# Patient Record
Sex: Female | Born: 1985 | Race: White | Hispanic: No | Marital: Married | Smoking: Current every day smoker
Health system: Southern US, Community
[De-identification: ages and names within clinical notes are randomized; demographics above are authoritative.]

## PROBLEM LIST (undated history)

## (undated) DIAGNOSIS — J45909 Unspecified asthma, uncomplicated: Secondary | ICD-10-CM

## (undated) HISTORY — PX: OTHER SURGICAL HISTORY: SHX169

---
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
## 2006-03-24 ENCOUNTER — Ambulatory Visit: Payer: Self-pay | Admitting: Psychiatry

## 2006-08-06 ENCOUNTER — Observation Stay: Payer: Self-pay

## 2006-08-08 ENCOUNTER — Inpatient Hospital Stay: Payer: Self-pay

## 2011-03-07 ENCOUNTER — Emergency Department: Payer: Self-pay | Admitting: Emergency Medicine

## 2012-06-26 ENCOUNTER — Ambulatory Visit: Payer: Self-pay | Admitting: Family Medicine

## 2012-10-29 ENCOUNTER — Emergency Department: Payer: Self-pay | Admitting: Emergency Medicine

## 2012-10-29 LAB — URINALYSIS, COMPLETE
Blood: NEGATIVE
Leukocyte Esterase: NEGATIVE
Nitrite: NEGATIVE
Ph: 5 (ref 4.5–8.0)
Protein: NEGATIVE
RBC,UR: 1 /HPF (ref 0–5)
Specific Gravity: 1.006 (ref 1.003–1.030)
Squamous Epithelial: 20
WBC UR: 3 /HPF (ref 0–5)

## 2012-10-29 LAB — CBC
HCT: 41.5 % (ref 35.0–47.0)
HGB: 14.2 g/dL (ref 12.0–16.0)
MCH: 32.7 pg (ref 26.0–34.0)
MCV: 95 fL (ref 80–100)
RBC: 4.35 10*6/uL (ref 3.80–5.20)
RDW: 13.7 % (ref 11.5–14.5)
WBC: 10.3 10*3/uL (ref 3.6–11.0)

## 2012-10-29 LAB — COMPREHENSIVE METABOLIC PANEL
Albumin: 3.6 g/dL (ref 3.4–5.0)
Bilirubin,Total: 0.4 mg/dL (ref 0.2–1.0)
Calcium, Total: 8.9 mg/dL (ref 8.5–10.1)
Creatinine: 0.48 mg/dL — ABNORMAL LOW (ref 0.60–1.30)
Potassium: 3.3 mmol/L — ABNORMAL LOW (ref 3.5–5.1)
SGOT(AST): 22 U/L (ref 15–37)
SGPT (ALT): 27 U/L (ref 12–78)

## 2012-10-29 LAB — PRO B NATRIURETIC PEPTIDE: B-Type Natriuretic Peptide: 43 pg/mL (ref 0–125)

## 2012-10-29 LAB — HCG, QUANTITATIVE, PREGNANCY: Beta Hcg, Quant.: 35399 m[IU]/mL — ABNORMAL HIGH

## 2013-03-31 ENCOUNTER — Observation Stay: Payer: Self-pay

## 2013-04-21 ENCOUNTER — Observation Stay: Payer: Self-pay

## 2013-04-23 ENCOUNTER — Observation Stay: Payer: Self-pay | Admitting: Obstetrics and Gynecology

## 2013-04-27 ENCOUNTER — Inpatient Hospital Stay: Payer: Self-pay | Admitting: Obstetrics and Gynecology

## 2013-04-28 LAB — CBC WITH DIFFERENTIAL/PLATELET
Basophil #: 0.1 10*3/uL (ref 0.0–0.1)
Eosinophil #: 0.1 10*3/uL (ref 0.0–0.7)
HCT: 37.4 % (ref 35.0–47.0)
HGB: 12.8 g/dL (ref 12.0–16.0)
Lymphocyte #: 2.3 10*3/uL (ref 1.0–3.6)
Lymphocyte %: 20.6 %
MCH: 34.5 pg — ABNORMAL HIGH (ref 26.0–34.0)
MCHC: 34.3 g/dL (ref 32.0–36.0)
Monocyte %: 8.4 %
Platelet: 167 10*3/uL (ref 150–440)
RBC: 3.72 10*6/uL — ABNORMAL LOW (ref 3.80–5.20)

## 2013-04-30 LAB — HEMATOCRIT: HCT: 34.5 % — ABNORMAL LOW (ref 35.0–47.0)

## 2014-09-27 NOTE — H&P (Signed)
L&D Evaluation:  History:  HPI 29 year old G2 P1001 with EDC=04/21/2013 by LMP=07/15/2012 presents at  40  weeks from office with for prolonged monitoring after baby had a variable deceleration in an otherwise reactive strip. AFI=10.73cm. Baby moving well. Denies VB or LOF. Has had some irregular ctxs.  Prenatal care begun at [facility] and [facility]. 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 LABS: AB POS, RI, VI, GBS negative. Received TDAP 9/29   Presents with Variable decel during NST in office.   Patient's Medical History Asthma  Hx of depression and substance abuse (past hx of cocaine, Vicodin and Xanax abuse as teen) (Hospitalized x 1 week for abuse of RX meds). 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 Albuterol prn   Allergies Sulfa   Social History tobacco  drugs  MJ in early pregnancy/ 2-3 cigs per day   Family History Non-Contributory   ROS:  ROS see HPI   Exam:  Vital Signs stable  117/74, 103/62   Urine Protein not completed   General no apparent distress   Mental Status clear   Chest wheezing scattered all fields   Heart normal sinus rhythm, no murmur/gallop/rubs   Abdomen gravid, non-tender   Estimated Fetal Weight 6#   Fetal Position cephalic   Edema no edema   Pelvic no external lesions, 1/60%/-1 (no change from office)   Mebranes Intact   FHT Initially baseline 160-170s with much fetal movement and hiccups, then  baseline changed to 140s then 130s over the last 1 1/2 hours of monitoring with accel;s to 160s. No decelerations noted over 3 hours of  monitoring.   Ucx irregular   Skin dry   Impression:  Impression IUP at 40 weeks with reactive, reassuring tracing.   Plan:  Plan DC home with labor precautions. FKCs daily. RTN to L&D Friday for NST. Has another NST/AFI scheduled for 8 Dec and IOL scheduled for 9 Dec (Cervidil)   Electronic Signatures: Trinna BalloonGutierrez, Geoge Lawrance L (CNM)  (Signed 04-Dec-14 00:25)  Authored: L&D Evaluation   Last Updated: 04-Dec-14 00:25 by Trinna BalloonGutierrez, Bintou Lafata L (CNM)

## 2014-09-27 NOTE — H&P (Signed)
L&D Evaluation:  History:  HPI 29 year old G2 P1001 with EDC=04/21/2013 by LMP=04/21/2013 presents at 37 weeks from office with c/o decreased FM today. Felt baby move once this AM. Denies VB. Has had occasional clear discharge. Some pelvic pressure, not sure if contracting. c/o some swelling in face, hands and feet, but BP in office 92/62. Prenatal care begun at [facility] and [facility]. 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 LABS: AB POS, RI, VI.   Presents with decreased fetal movement   Patient's Medical History Asthma  Hx of depression and substance abuse (past hx of cocaine, Vicodin and Xanax abuse as teen) (Hospitalized x 1 week for abuse of RX meds). Mild MVP with regurg. +IPPD this pregnancyitation.   Patient's Surgical History Adenoidectomy   Medications QVAR-1 puff BID, PNV, Zyrtec, Zantac 75 mg. Albuterol prn   Allergies Sulfa   Social History tobacco  drugs  MJ in early pregnancy/ 2-3 cigs per day   Family History Non-Contributory   ROS:  ROS see HPi   Exam:  Vital Signs 107/60   Urine Protein not completed   General no apparent distress   Mental Status clear   Abdomen gravid, non-tender   Fetal Position cephalic   Edema trace edema in feet   Pelvic no external lesions, cx=FT/75%/-2   Mebranes Intact, Nitrazine negative   FHT normal rate with no decels, 135 with accels to 150s to 160s   FHT Description mod variability   Ucx initially not picking up then q2-3 x 30-50 sec, occ ctx lasting 60 sec, mild   Skin dry   Impression:  Impression IUP at 37 weeks with reactive NST/negative CST   Plan:  Plan DC home with labor precautions. Continue FKCs daily   Electronic  Signatures: Trinna BalloonGutierrez, Onita Pfluger L (CNM)  (Signed [phone]-Nov-14 00:24)  Authored: L&D Evaluation   Last Updated: 13-Nov-14 00:24 by Trinna BalloonGutierrez, Makenna Macaluso L (CNM)

## 2014-12-10 IMAGING — US US OB LIMITED
1 series · 14 of 17 positions shown · non-contrast
Comparison: none

REASON FOR EXAM: 15 weeks pregnant, abdominal pain, syncope
COMMENTS:   May transport without cardiac monitor

PROCEDURE:     US  - US LIMITED OB  - October 29, 2012  [DATE]
RESULT:     Comparison: None.
TECHNIQUE: Multiple grayscale and color Doppler images were obtained of the
pelvis by transabdominal ultrasound.

[Series 1: us ob limited · 0.23mm/px · 17 acquisitions, 14 frames shown]
[im 1/17]
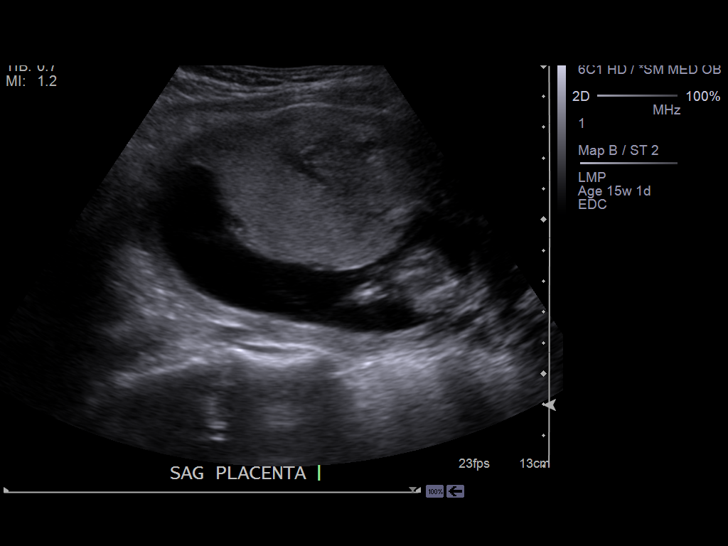
[im 2/17]
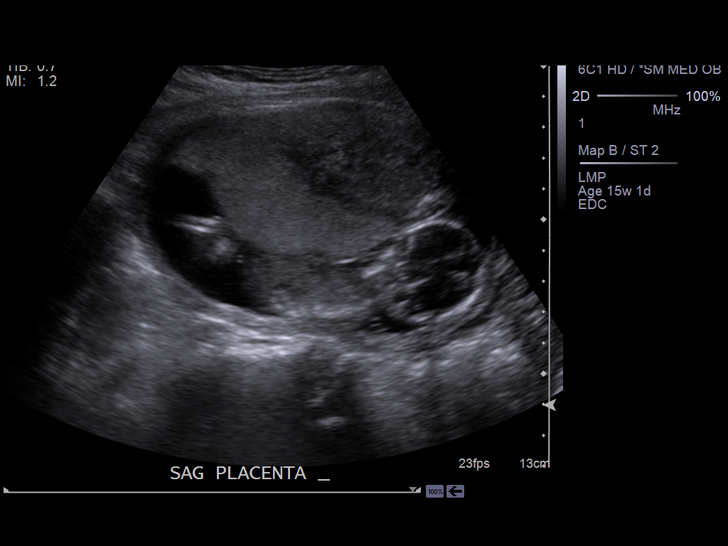
[im 4/17]
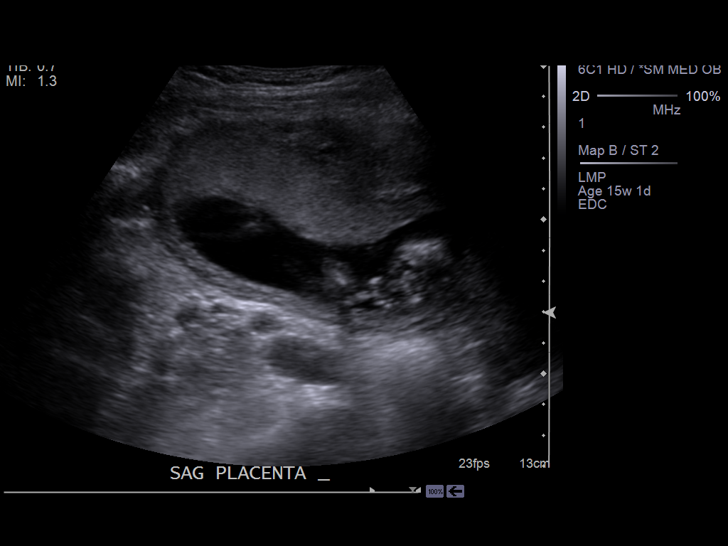
[im 5/17]
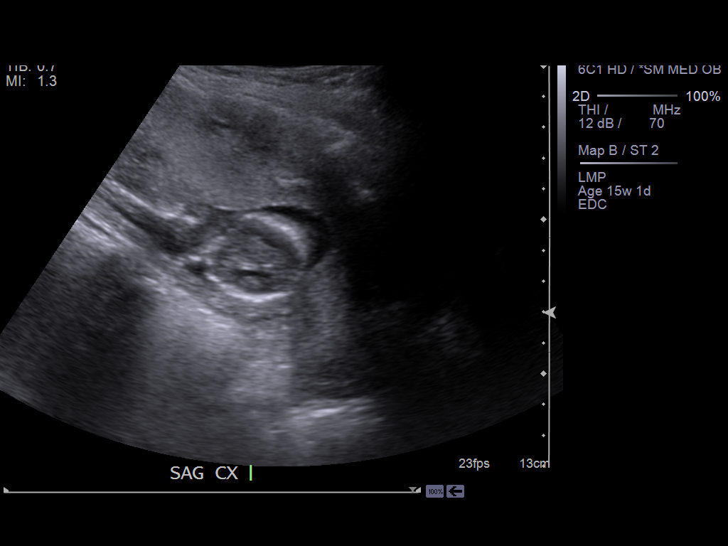
[im 6/17]
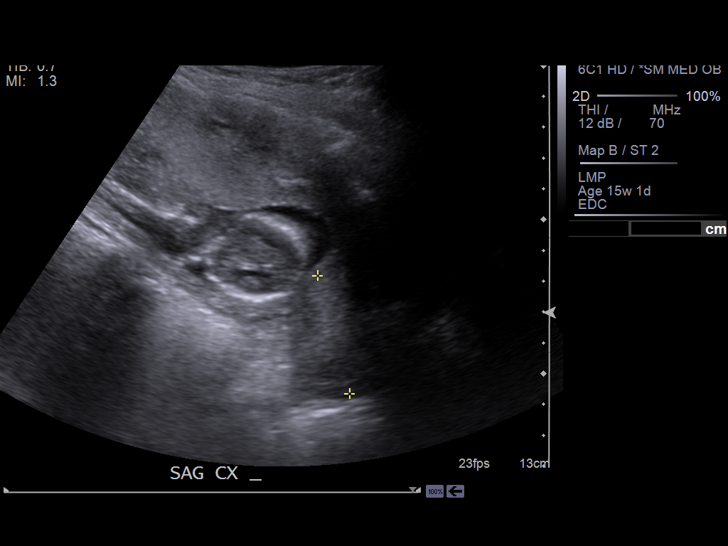
[im 7/17]
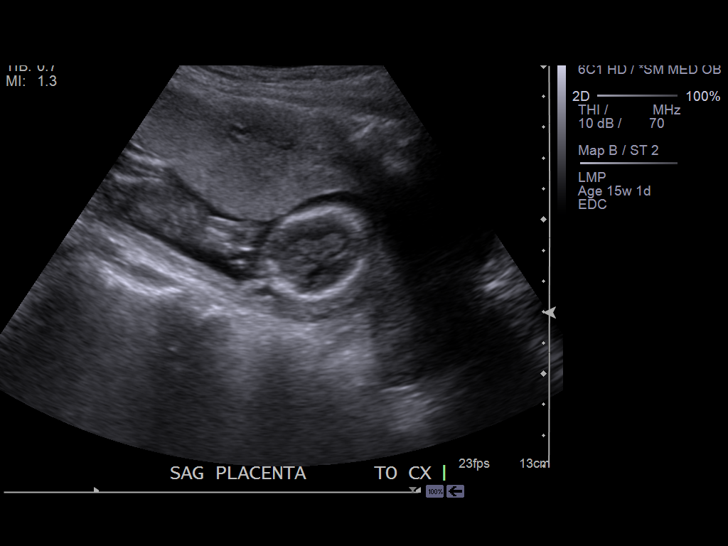
[im 8/17]
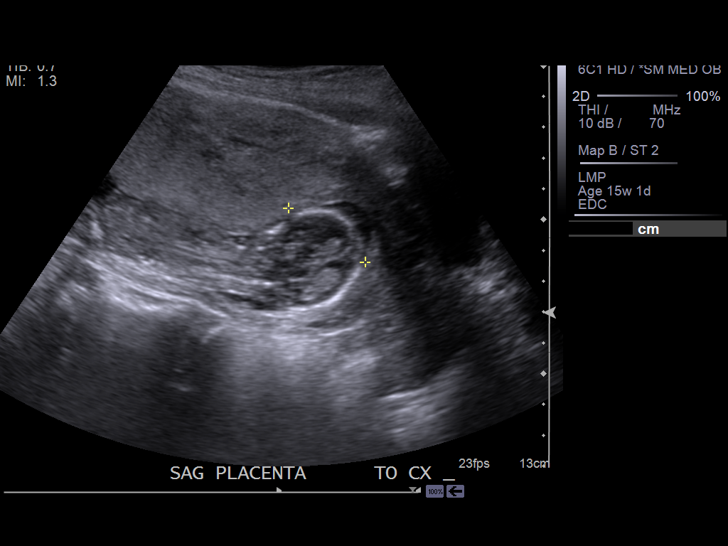
[im 10/17]
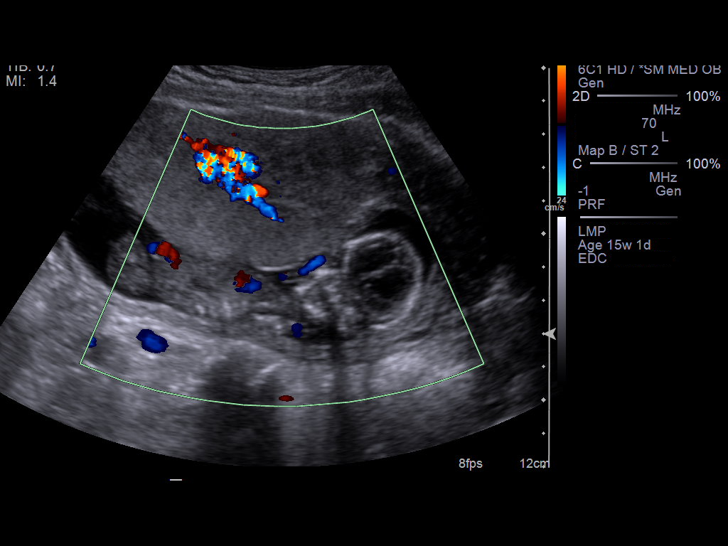
[im 11/17]
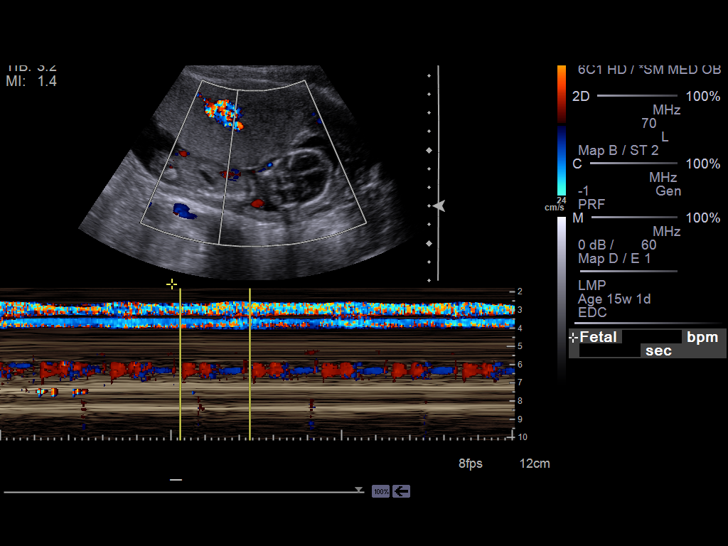
[im 12/17]
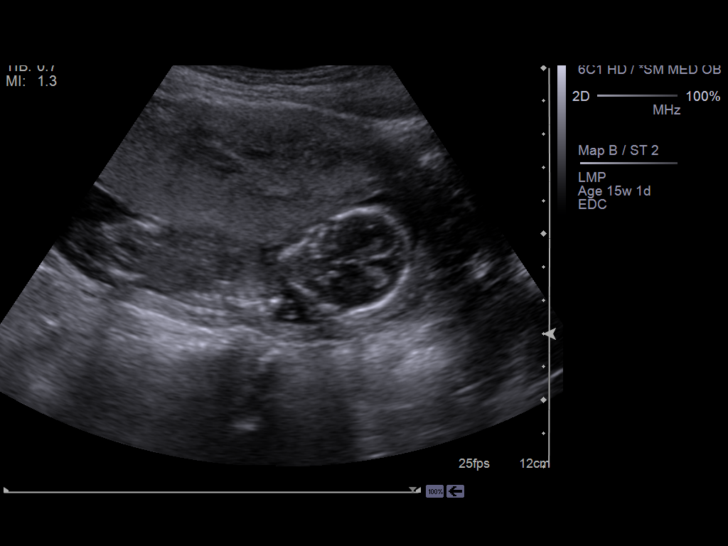
[im 13/17]
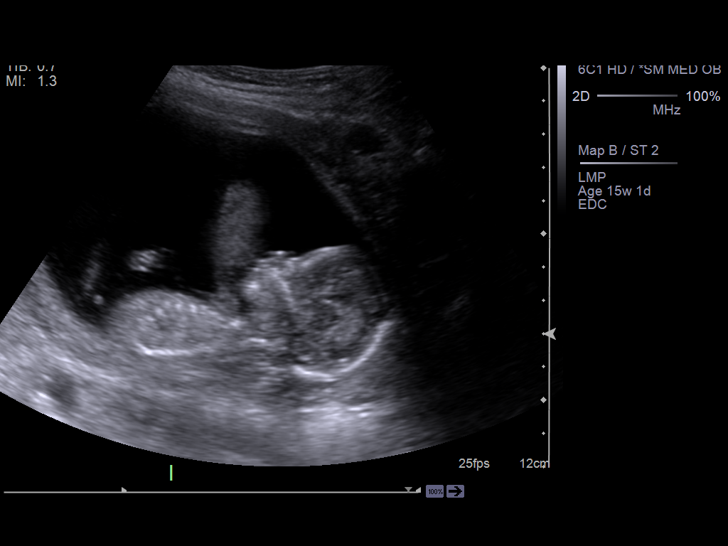
[im 14/17]
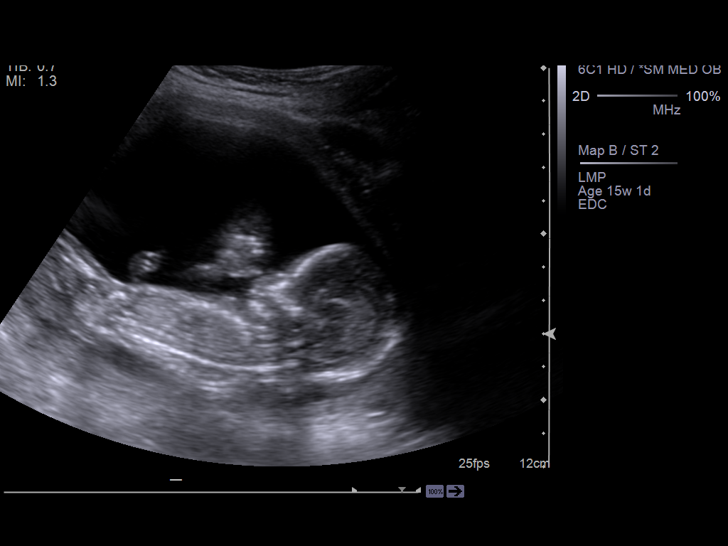
[im 16/17]
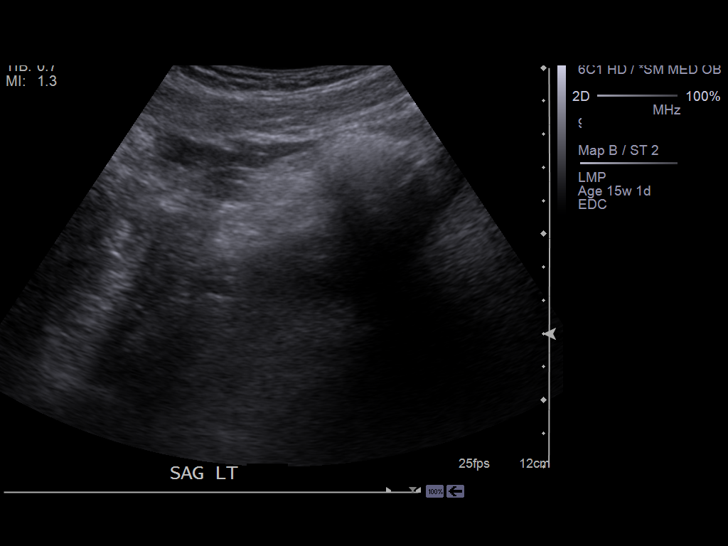
[im 17/17]
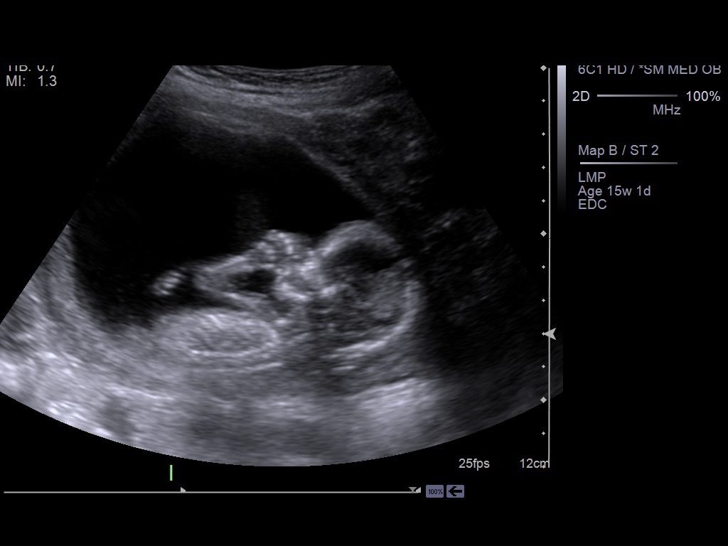

[14 of 17 positions shown; findings below may reference images not displayed]

FINDINGS: There is a single live intrauterine pregnancy. Fetal heart rate is 147 beats
per minute. The placenta is in an anterior position. The cervix measures a
4.0 cm in length and is closed. No evidence of placenta previa. The amniotic
fluid index is subjectively within normal limits. Please note, this is not a
detailed fetal anatomic study.

The ovaries were not visualized.
IMPRESSION: 1. Single live intrauterine pregnancy. No acute findings.
2. The ovaries were not visualized.

[REDACTED]

## 2015-02-12 ENCOUNTER — Emergency Department
Admission: EM | Admit: 2015-02-12 | Discharge: 2015-02-12 | Disposition: A | Discharge status: Home / Self Care | Payer: Self-pay | Attending: Emergency Medicine | Admitting: Emergency Medicine

## 2015-02-12 ENCOUNTER — Emergency Department: Payer: Self-pay

## 2015-02-12 DIAGNOSIS — J189 Pneumonia, unspecified organism: Secondary | ICD-10-CM

## 2015-02-12 DIAGNOSIS — J159 Unspecified bacterial pneumonia: Secondary | ICD-10-CM | POA: Insufficient documentation

## 2015-02-12 DIAGNOSIS — M549 Dorsalgia, unspecified: Secondary | ICD-10-CM | POA: Insufficient documentation

## 2015-02-12 MED ORDER — PREDNISONE 10 MG PO TABS: 50.0000 mg | ORAL_TABLET | Freq: Every day | ORAL | Status: DC

## 2015-02-12 MED ORDER — LEVOFLOXACIN 750 MG PO TABS: 750.0000 mg | ORAL_TABLET | Freq: Every day | ORAL | Status: AC

## 2015-02-12 MED ORDER — GUAIFENESIN-CODEINE 100-10 MG/5ML PO SOLN: 10.0000 mL | Freq: Three times a day (TID) | ORAL | Status: DC | PRN

## 2015-02-12 MED ORDER — ALBUTEROL SULFATE HFA 108 (90 BASE) MCG/ACT IN AERS: 2.0000 | INHALATION_SPRAY | Freq: Four times a day (QID) | RESPIRATORY_TRACT | Status: AC | PRN

## 2015-02-12 MED ORDER — LIDOCAINE HCL (PF) 1 % IJ SOLN
2.1000 mL | Freq: Once | INTRAMUSCULAR | Status: AC
  Administered 2015-02-12: 2.1 mL via INTRADERMAL
  Filled 2015-02-12: qty 5

## 2015-02-12 MED ORDER — CEFTRIAXONE SODIUM 1 G IJ SOLR
1.0000 g | Freq: Once | INTRAMUSCULAR | Status: AC
  Administered 2015-02-12: 1 g via INTRAMUSCULAR
  Filled 2015-02-12: qty 10

## 2015-02-12 NOTE — ED Provider Notes (Signed)
[facility] Emergency Department Provider Note ____________________________________________  Time seen: Approximately 1:53 PM  I have reviewed the triage vital signs and the nursing notes.   HISTORY  Chief Complaint Cough   HPI Sarah Thompson is a 29 y.o. female who presents to the emergency department for cough and congestion for the past week. She states that she went to class and that on Tuesday was diagnosed with pneumonia. She had been placed on a Z-Pak and had been given an injection of Decadron. She continues to have shortness of breath and cough without improvement.  No past medical history on file.  There are no active problems to display for this patient.   No past surgical history on file.  Current Outpatient Rx  Name  Route  Sig  Dispense  Refill  . albuterol (PROVENTIL HFA;VENTOLIN HFA) 108 (90 BASE) MCG/ACT inhaler   Inhalation   Inhale 2 puffs into the lungs every 6 (six) hours as needed for wheezing or shortness of breath.   1 Inhaler   2   . guaiFENesin-codeine 100-10 MG/5ML syrup   Oral   Take 10 mLs by mouth 3 (three) times daily as needed for cough.   120 mL   0   . levofloxacin (LEVAQUIN) 750 MG tablet   Oral   Take 1 tablet (750 mg total) by mouth daily.   7 tablet   0   . predniSONE (DELTASONE) 10 MG tablet   Oral   Take 5 tablets (50 mg total) by mouth daily.   25 tablet   0     Allergies Sulfa antibiotics  No family history on file.  Social History Social History  Substance Use Topics  . Smoking status: Not on file  . Smokeless tobacco: Not on file  . Alcohol Use: Not on file    Review of Systems Constitutional: No fever/chills Eyes: No visual changes. ENT: No sore throat. Cardiovascular: Denies chest pain. Respiratory: Denies shortness of breath. Gastrointestinal: No abdominal pain.  No nausea, no vomiting.  No diarrhea.  No constipation. Genitourinary: Negative for dysuria. Musculoskeletal:  Positive for back pain. Skin: Negative for rash. Neurological: Negative for headaches, focal weakness or numbness.  10-point ROS otherwise negative.  ____________________________________________   PHYSICAL EXAM:  VITAL SIGNS: ED Triage Vitals  Enc Vitals Group     BP 02/12/15 1205 101/69 mmHg     Pulse Rate 02/12/15 1205 90     Resp 02/12/15 1205 18     Temp 02/12/15 1205 98.4 F (36.9 C)     Temp Source 02/12/15 1205 Oral     SpO2 02/12/15 1205 100 %     Weight 02/12/15 1205 100 lb (45.36 kg)     Height 02/12/15 1205  (1.499 m)     Head Cir --      Peak Flow --      Pain Score 02/12/15 1205 8     Pain Loc --      Pain Edu? --      Excl. in GC? --     Constitutional: Alert and oriented. Well appearing and in no acute distress. Eyes: Conjunctivae are normal. PERRL. EOMI. Head: Atraumatic. Nose: No congestion/rhinnorhea. Mouth/Throat: Mucous membranes are moist.  Oropharynx non-erythematous. Neck: No stridor.   Cardiovascular: Normal rate, regular rhythm. Grossly normal heart sounds.  Good peripheral circulation. Respiratory: Normal respiratory effort.  No retractions. Scattered rhonchi, worse on the right. Gastrointestinal: Soft and nontender. No distention. No abdominal bruits. No CVA  tenderness. Musculoskeletal: No lower extremity tenderness nor edema.  No joint effusions. Neurologic:  Normal speech and language. No gross focal neurologic deficits are appreciated. No gait instability. Skin:  Skin is warm, dry and intact. No rash noted. Psychiatric: Mood and affect are normal. Speech and behavior are normal.  ____________________________________________   LABS (all labs ordered are listed, but only abnormal results are displayed)  Labs Reviewed - No data to display ____________________________________________  EKG   ____________________________________________  RADIOLOGY  Right middle and lingular opacities.   ____________________________________________   PROCEDURES  Procedure(s) performed: None  Critical Care performed: No  ____________________________________________   INITIAL IMPRESSION / ASSESSMENT AND PLAN / ED COURSE  Pertinent labs & imaging results that were available during my care of the patient were reviewed by me and considered in my medical decision making (see chart for details).  Patient will receive Rocephin 1g IM today in the ER and a prescription for Levaquin, Albuterol, Prednisone, and Codeine with Guaifenesin. She was given strict return precautions and advised to return in 2 days if not greatly improved.  ____________________________________________   FINAL CLINICAL IMPRESSION(S) / ED DIAGNOSES  Final diagnoses:  Community acquired pneumonia      Chinita Pester, FNP 02/12/15 1531  Sharman Cheek, MD 02/12/15 [phone]

## 2015-02-12 NOTE — ED Notes (Signed)
**Note De-Identified Sarah Thompson Obfuscation** Pt reports that she began having cough and congestion 1 week and went to [facility] Tuesday and was dx with pneumonia, pt was placed on z-pak and given shot of decadron, continues to have cough and sob. NAD noted in triage.

## 2015-02-12 NOTE — Discharge Instructions (Signed)
If you are not feeling better in 2 days, return to the emergency department. Return sooner if your symptoms worsen.

## 2015-02-22 ENCOUNTER — Emergency Department: Payer: Self-pay

## 2015-02-22 ENCOUNTER — Emergency Department
Admission: EM | Admit: 2015-02-22 | Discharge: 2015-02-22 | Disposition: A | Discharge status: Home / Self Care | Payer: Self-pay | Attending: Emergency Medicine | Admitting: Emergency Medicine

## 2015-02-22 DIAGNOSIS — Z720 Tobacco use: Secondary | ICD-10-CM | POA: Insufficient documentation

## 2015-02-22 DIAGNOSIS — Z3202 Encounter for pregnancy test, result negative: Secondary | ICD-10-CM | POA: Insufficient documentation

## 2015-02-22 DIAGNOSIS — M549 Dorsalgia, unspecified: Secondary | ICD-10-CM | POA: Insufficient documentation

## 2015-02-22 DIAGNOSIS — J471 Bronchiectasis with (acute) exacerbation: Secondary | ICD-10-CM | POA: Insufficient documentation

## 2015-02-22 LAB — COMPREHENSIVE METABOLIC PANEL
ALK PHOS: 69 U/L (ref 38–126)
ALT: 13 U/L — AB (ref 14–54)
AST: 18 U/L (ref 15–41)
Albumin: 4.4 g/dL (ref 3.5–5.0)
Anion gap: 6 (ref 5–15)
BUN: 15 mg/dL (ref 6–20)
CALCIUM: 9 mg/dL (ref 8.9–10.3)
CO2: 23 mmol/L (ref 22–32)
CREATININE: 0.68 mg/dL (ref 0.44–1.00)
Chloride: 107 mmol/L (ref 101–111)
Glucose, Bld: 105 mg/dL — ABNORMAL HIGH (ref 65–99)
Potassium: 3.9 mmol/L (ref 3.5–5.1)
Sodium: 136 mmol/L (ref 135–145)
Total Bilirubin: 0.3 mg/dL (ref 0.3–1.2)
Total Protein: 7.2 g/dL (ref 6.5–8.1)

## 2015-02-22 LAB — CBC WITH DIFFERENTIAL/PLATELET
BASOS PCT: 1 %
Basophils Absolute: 0.1 10*3/uL (ref 0–0.1)
EOS ABS: 0.4 10*3/uL (ref 0–0.7)
EOS PCT: 4 %
HCT: 44.7 % (ref 35.0–47.0)
HEMOGLOBIN: 15.1 g/dL (ref 12.0–16.0)
Lymphocytes Relative: 17 %
Lymphs Abs: 1.9 10*3/uL (ref 1.0–3.6)
MCH: 31.5 pg (ref 26.0–34.0)
MCHC: 33.7 g/dL (ref 32.0–36.0)
MCV: 93.4 fL (ref 80.0–100.0)
MONOS PCT: 8 %
Monocytes Absolute: 0.9 10*3/uL (ref 0.2–0.9)
NEUTROS PCT: 70 %
Neutro Abs: 7.7 10*3/uL — ABNORMAL HIGH (ref 1.4–6.5)
PLATELETS: 300 10*3/uL (ref 150–440)
RBC: 4.79 MIL/uL (ref 3.80–5.20)
RDW: 14 % (ref 11.5–14.5)
WBC: 11 10*3/uL (ref 3.6–11.0)

## 2015-02-22 LAB — POCT PREGNANCY, URINE: PREG TEST UR: NEGATIVE

## 2015-02-22 MED ORDER — HYDROCOD POLST-CPM POLST ER 10-8 MG/5ML PO SUER: 5.0000 mL | Freq: Two times a day (BID) | ORAL | Status: AC | PRN

## 2015-02-22 MED ORDER — ALBUTEROL SULFATE (2.5 MG/3ML) 0.083% IN NEBU
2.5000 mg | INHALATION_SOLUTION | Freq: Once | RESPIRATORY_TRACT | Status: AC
  Administered 2015-02-22: 2.5 mg via RESPIRATORY_TRACT
  Filled 2015-02-22: qty 3

## 2015-02-22 MED ORDER — GUAIFENESIN ER 600 MG PO TB12: 600.0000 mg | ORAL_TABLET | Freq: Two times a day (BID) | ORAL | Status: AC

## 2015-02-22 MED ORDER — METHYLPREDNISOLONE SODIUM SUCC 125 MG IJ SOLR
125.0000 mg | Freq: Once | INTRAMUSCULAR | Status: AC
  Administered 2015-02-22: 125 mg via INTRAVENOUS
  Filled 2015-02-22: qty 2

## 2015-02-22 MED ORDER — SODIUM CHLORIDE 0.9 % IV BOLUS (SEPSIS)
1000.0000 mL | Freq: Once | INTRAVENOUS | Status: AC
  Administered 2015-02-22: 1000 mL via INTRAVENOUS

## 2015-02-22 MED ORDER — MOXIFLOXACIN HCL 400 MG PO TABS: 400.0000 mg | ORAL_TABLET | Freq: Every day | ORAL | Status: AC

## 2015-02-22 MED ORDER — PREDNISONE 10 MG (21) PO TBPK: ORAL_TABLET | ORAL | Status: DC

## 2015-02-22 NOTE — Discharge Instructions (Signed)
Bronchiectasis Bronchiectasis is a condition in which the airways (bronchi) are damaged and widened. This makes it difficult for the lungs to get rid of mucus. As a result, mucus gathers in the airways, and this often leads to lung infections. Infection can cause inflammation in the airways, which may further weaken and damage the bronchi.  CAUSES  Bronchiectasis may be present at birth (congenital) or may develop later in life. Sometimes there is no apparent cause. Some common causes include:  Cystic fibrosis.   Recurrent lung infections (such as pneumonia, tuberculosis, or fungal infections).  Foreign bodies or other blockages in the lungs.  Breathing in fluid, food, or other foreign objects (aspiration). SIGNS AND SYMPTOMS  Common symptoms include:  A daily cough that brings up mucus and lasts for more than 3 weeks.  Frequent lung infections (such as pneumonia, tuberculosis, or fungal infections).  Shortness of breath and wheezing.   Weakness and fatigue. DIAGNOSIS  Various tests may be done to help diagnose bronchiectasis. Tests may include:  Chest X-rays or CT scans.   Breathing tests to help determine how your lungs are working.   Sputum cultures to check for infection.   Blood tests and other tests to check for related diseases or causes, such as cystic fibrosis. TREATMENT  Treatment varies depending on the severity of the condition. Medicines may be given to loosen the mucus to be coughed up (expectorants), to relax the muscles of the air passages (bronchodilators), or to prevent or treat infections (antibiotics). Physical therapy methods may be recommended to help clear mucus from the lungs. For severe cases, surgery may be done to remove the affected part of the lung. HOME CARE INSTRUCTIONS   Get plenty of rest.   Only take over-the-counter or prescription medicines as directed by your health care provider. If antibiotic medicines were prescribed, take them as  directed. Finish them even if you start to feel better.  Avoid sedatives and antihistamines unless otherwise directed by your health care provider. These medicines tend to thicken the mucus in the lungs.   Perform any breathing exercises or techniques to clear the lungs as directed by your health care provider.  Drink enough fluids to keep your urine clear or pale yellow.  Consider using a cold steam vaporizer or humidifier in your room or home to help loosen secretions.   If the cough is worse at night, try sleeping in a semi-upright position in a recliner or using a couple of pillows.   Avoid cigarette smoke and lung irritants. If you smoke, quit.  Stay inside when pollution and ozone levels are high.   Stay current with vaccinations and immunizations.   Follow up with your health care provider as directed.  SEEK MEDICAL CARE IF:  You cough up more thick, discolored mucus (sputum) that is yellow to green in color.  You have a fever or persistent symptoms for more than 2-3 days.  You cannot control your cough and are losing sleep. SEEK IMMEDIATE MEDICAL CARE IF:   You cough up blood.   You have chest pain or increasing shortness of breath.   You have pain that is getting worse or is uncontrolled with medicines.   You have a fever and your symptoms suddenly get worse. MAKE SURE YOU:  Understand these instructions.   Will watch your condition.   Will get help right away if you are not doing well or get worse.    This information is not intended to replace advice given to   you by your health care provider. Make sure you discuss any questions you have with your health care provider.   Document Released: 03/03/2007 Document Revised: 05/11/2013 Document Reviewed: 11/11/2012 Elsevier Interactive Patient Education 2016 [employer]  

## 2015-02-22 NOTE — ED Provider Notes (Signed)
[facility] Emergency Department Provider Note ____________________________________________  Time seen: Approximately 8:54 AM  I have reviewed the triage vital signs and the nursing notes.   HISTORY  Chief Complaint Cough   HPI Sedra Morfin Hardcastle is a 29 y.o. female who presents to the emergency department for evaluation of cough and congestion. She was diagnosed on September 25 witha right middle and lingular pneumonia. While she was on the antibiotic and the steroid she felt better, but after she finished those the cough and the pain in her back has returned. She denies fever. She states that the cough is occasionally productive.   History reviewed. No pertinent past medical history.  There are no active problems to display for this patient.   Past Surgical History  Procedure Laterality Date  . Adnoids removed      Current Outpatient Rx  Name  Route  Sig  Dispense  Refill  . albuterol (PROVENTIL HFA;VENTOLIN HFA) 108 (90 BASE) MCG/ACT inhaler   Inhalation   Inhale 2 puffs into the lungs every 6 (six) hours as needed for wheezing or shortness of breath.   1 Inhaler   2   . chlorpheniramine-HYDROcodone (TUSSIONEX PENNKINETIC ER) 10-8 MG/5ML SUER   Oral   Take 5 mLs by mouth every 12 (twelve) hours as needed for cough.   140 mL   0   . guaiFENesin (MUCINEX) 600 MG 12 hr tablet   Oral   Take 1 tablet (600 mg total) by mouth 2 (two) times daily.   60 tablet   0   . moxifloxacin (AVELOX) 400 MG tablet   Oral   Take 1 tablet (400 mg total) by mouth daily.   14 tablet   0   . predniSONE (STERAPRED UNI-PAK 21 TAB) 10 MG (21) TBPK tablet      Take 6 tablets on day 1 Take 5 tablets on day 2 Take 4 tablets on day 3 Take 3 tablets on day 4 Take 2 tablets on day 5 Take 1 tablet on day 6   21 tablet   0     Allergies Sulfa antibiotics  No family history on file.  Social History Social History  Substance Use Topics  . Smoking status:  Current Every Day Smoker  . Smokeless tobacco: Never Used  . Alcohol Use: No    Review of Systems Constitutional: No fever/chills Eyes: No visual changes. ENT: No sore throat. Cardiovascular: Denies chest pain. Respiratory: Occasional shortness of breath. Gastrointestinal: No abdominal pain.  No nausea, no vomiting.  No diarrhea.  No constipation. Genitourinary: Negative for dysuria. Musculoskeletal: Positive for back pain. Skin: Negative for rash. Neurological: Negative for headaches, focal weakness or numbness.  10-point ROS otherwise negative.  ____________________________________________   PHYSICAL EXAM:  VITAL SIGNS: ED Triage Vitals  Enc Vitals Group     BP 02/22/15 0833 127/71 mmHg     Pulse Rate 02/22/15 0833 91     Resp 02/22/15 0833 18     Temp 02/22/15 0833 98.1 F (36.7 C)     Temp Source 02/22/15 0833 Oral     SpO2 02/22/15 0833 99 %     Weight 02/22/15 0833 100 lb (45.36 kg)     Height 02/22/15 0833  (1.499 m)     Head Cir --      Peak Flow --      Pain Score 02/22/15 0844 8     Pain Loc --      Pain Edu? --  Excl. in GC? --     Constitutional: Alert and oriented. Ill appearing and in no acute distress. Eyes: Conjunctivae are normal. PERRL. EOMI. Head: Atraumatic. Nose: No congestion/rhinnorhea. Mouth/Throat: Mucous membranes are moist.  Oropharynx non-erythematous. Neck: No stridor.   Cardiovascular: Normal rate, regular rhythm. Grossly normal heart sounds.  Good peripheral circulation. Respiratory: Normal respiratory effort.  No retractions. Scattered wheezes and rhonchi in all fields. Gastrointestinal: Soft and nontender. No distention. No abdominal bruits. No CVA tenderness. Musculoskeletal: No lower extremity tenderness nor edema.  No joint effusions. Neurologic:  Normal speech and language. No gross focal neurologic deficits are appreciated. No gait instability. Skin:  Skin is warm, dry and intact. No rash noted. Psychiatric: Mood  and affect are normal. Speech and behavior are normal.  ____________________________________________   LABS (all labs ordered are listed, but only abnormal results are displayed)  Labs Reviewed  COMPREHENSIVE METABOLIC PANEL - Abnormal; Notable for the following:    Glucose, Bld 105 (*)    ALT 13 (*)    All other components within normal limits  CBC WITH DIFFERENTIAL/PLATELET - Abnormal; Notable for the following:    Neutro Abs 7.7 (*)    All other components within normal limits  CULTURE, BLOOD (ROUTINE X 2)  CULTURE, BLOOD (ROUTINE X 2)  POC URINE PREG, ED  POCT PREGNANCY, URINE   ____________________________________________  EKG   ____________________________________________  RADIOLOGY   DG Chest 2 View (Final result) Result time: 02/22/15 09:03:35   Final result by Rad Results In Interface (02/22/15 09:03:35)   Narrative:   CLINICAL DATA: 29 year old female with left chest pain for 2 weeks. Recently treated pneumonia but symptoms persist. Subsequent encounter. Smoker.  EXAM: CHEST 2 VIEW  COMPARISON: 02/12/2015 and earlier.  FINDINGS: Chronic basilar predominant increased pulmonary interstitial markings. Decreased bilateral middle lobe patchy and nodular opacity since September. No pleural effusion. No areas of worsening ventilation. Chronic tenting of the inferior left pulmonary ligament. Some tram tracking is suggested in the middle lobes such as due to bronchiectasis. Mediastinal contours remain normal. Visualized tracheal air column is within normal limits. No pneumothorax. No acute osseous abnormality identified.  IMPRESSION: Regressed bilateral middle lobe patchy and nodular opacity since September.  Lung markings now appear at baseline, with a degree of bilateral middle lobe Bronchiectasis and other chronic pulmonary interstitial changes suspected and probably related to smoking.   Electronically Signed By: Odessa Fleming M.D. On: 02/22/2015  09:03       ____________________________________________   PROCEDURES  Procedure(s) performed: None  Critical Care performed: No  ____________________________________________   INITIAL IMPRESSION / ASSESSMENT AND PLAN / ED COURSE  Pertinent labs & imaging results that were available during my care of the patient were reviewed by me and considered in my medical decision making (see chart for details).  Patient began to feel better after Duo Neb treatment. She also received 1 liter of NS while here. She is noted to have a harsh, nonproductive cough.  She will be discharged home with Avelox x 14 days, prednisone taper, mucinex, and tussionex. She will continue to use her albuterol inhaler.   Patient strongly advised to stop smoking and to avoid being around secondhand smoke. She was also strongly advised to follow up with pulmonology. She was given strict return precautions. ____________________________________________   FINAL CLINICAL IMPRESSION(S) / ED DIAGNOSES  Final diagnoses:  Bronchiectasis with acute exacerbation [facility])      Chinita Pester, FNP 02/22/15 1438  Myrna Blazer, MD 02/22/15 [phone]

## 2015-02-22 NOTE — ED Notes (Signed)
Pt states she has had cough and congestion for the past 3 weeks, was seen here last week and dx with pneumonia and rx abx, states she is not getting any better..states left lateral ribs hurts

## 2015-02-27 LAB — CULTURE, BLOOD (ROUTINE X 2)
CULTURE: NO GROWTH
CULTURE: NO GROWTH

## 2015-03-12 ENCOUNTER — Encounter: Payer: Self-pay | Admitting: Emergency Medicine

## 2015-03-12 ENCOUNTER — Emergency Department
Admission: EM | Admit: 2015-03-12 | Discharge: 2015-03-12 | Disposition: A | Discharge status: Home / Self Care | Payer: Self-pay | Attending: Emergency Medicine | Admitting: Emergency Medicine

## 2015-03-12 ENCOUNTER — Emergency Department: Payer: Self-pay

## 2015-03-12 DIAGNOSIS — Z720 Tobacco use: Secondary | ICD-10-CM | POA: Insufficient documentation

## 2015-03-12 DIAGNOSIS — Z79899 Other long term (current) drug therapy: Secondary | ICD-10-CM | POA: Insufficient documentation

## 2015-03-12 DIAGNOSIS — J45901 Unspecified asthma with (acute) exacerbation: Secondary | ICD-10-CM | POA: Insufficient documentation

## 2015-03-12 DIAGNOSIS — R0789 Other chest pain: Secondary | ICD-10-CM | POA: Insufficient documentation

## 2015-03-12 HISTORY — DX: Unspecified asthma, uncomplicated: J45.909

## 2015-03-12 LAB — CBC WITH DIFFERENTIAL/PLATELET
Basophils Absolute: 0.1 10*3/uL (ref 0–0.1)
Basophils Relative: 1 %
EOS ABS: 0.1 10*3/uL (ref 0–0.7)
EOS PCT: 1 %
HCT: 46.4 % (ref 35.0–47.0)
HEMOGLOBIN: 15.6 g/dL (ref 12.0–16.0)
LYMPHS ABS: 1.6 10*3/uL (ref 1.0–3.6)
LYMPHS PCT: 15 %
MCH: 31.7 pg (ref 26.0–34.0)
MCHC: 33.6 g/dL (ref 32.0–36.0)
MCV: 94.4 fL (ref 80.0–100.0)
MONOS PCT: 6 %
Monocytes Absolute: 0.7 10*3/uL (ref 0.2–0.9)
NEUTROS PCT: 77 %
Neutro Abs: 8.2 10*3/uL — ABNORMAL HIGH (ref 1.4–6.5)
Platelets: 269 10*3/uL (ref 150–440)
RBC: 4.91 MIL/uL (ref 3.80–5.20)
RDW: 14.3 % (ref 11.5–14.5)
WBC: 10.7 10*3/uL (ref 3.6–11.0)

## 2015-03-12 LAB — COMPREHENSIVE METABOLIC PANEL
ALK PHOS: 77 U/L (ref 38–126)
ALT: 12 U/L — ABNORMAL LOW (ref 14–54)
ANION GAP: 7 (ref 5–15)
AST: 16 U/L (ref 15–41)
Albumin: 4.2 g/dL (ref 3.5–5.0)
BUN: 15 mg/dL (ref 6–20)
CALCIUM: 9.2 mg/dL (ref 8.9–10.3)
CO2: 19 mmol/L — AB (ref 22–32)
Chloride: 112 mmol/L — ABNORMAL HIGH (ref 101–111)
Creatinine, Ser: 0.68 mg/dL (ref 0.44–1.00)
Glucose, Bld: 115 mg/dL — ABNORMAL HIGH (ref 65–99)
Potassium: 3.7 mmol/L (ref 3.5–5.1)
SODIUM: 138 mmol/L (ref 135–145)
TOTAL PROTEIN: 7.5 g/dL (ref 6.5–8.1)
Total Bilirubin: 0.6 mg/dL (ref 0.3–1.2)

## 2015-03-12 LAB — FIBRIN DERIVATIVES D-DIMER (ARMC ONLY): FIBRIN DERIVATIVES D-DIMER (ARMC): 255 (ref 0–499)

## 2015-03-12 MED ORDER — KETOROLAC TROMETHAMINE 30 MG/ML IJ SOLN
30.0000 mg | Freq: Once | INTRAMUSCULAR | Status: AC
  Administered 2015-03-12: 30 mg via INTRAVENOUS
  Filled 2015-03-12: qty 1

## 2015-03-12 MED ORDER — PREDNISONE 20 MG PO TABS
60.0000 mg | ORAL_TABLET | Freq: Once | ORAL | Status: AC
  Administered 2015-03-12: 60 mg via ORAL
  Filled 2015-03-12: qty 3

## 2015-03-12 MED ORDER — LORAZEPAM 0.5 MG PO TABS
0.5000 mg | ORAL_TABLET | Freq: Once | ORAL | Status: AC
  Administered 2015-03-12: 0.5 mg via ORAL
  Filled 2015-03-12: qty 1

## 2015-03-12 MED ORDER — TRAMADOL HCL 50 MG PO TABS: 50.0000 mg | ORAL_TABLET | Freq: Four times a day (QID) | ORAL | Status: AC | PRN

## 2015-03-12 MED ORDER — PREDNISONE 20 MG PO TABS: 20.0000 mg | ORAL_TABLET | Freq: Every day | ORAL | Status: AC

## 2015-03-12 MED ORDER — LORAZEPAM 1 MG PO TABS: 1.0000 mg | ORAL_TABLET | Freq: Three times a day (TID) | ORAL | Status: AC | PRN

## 2015-03-12 NOTE — ED Provider Notes (Signed)
Time Seen: Approximately1055 I have reviewed the triage notes  Chief Complaint: Shortness of Breath   History of Present Illness: Sarah Thompson is a 29 y.o. female who presents with left-sided chest discomfort and points to an area slightly posterior to her left axillary area. She denies any direct trauma to this area and states she's been worked up here before: Sounds like bronchiectasis and has a follow-up appointment with a pulmonologist. The pain appears to be pleuritic in nature without significant calf tenderness or swelling. She denies any nausea, vomiting, true shortness of breath, etc. She states she's been on a couple courses of antibiotics without any symptomatic improvement. She states she does feel better whenever she takes steroids and just finished a recent taper she states she will works now on a pulmonologist office and had an appointment on Friday but couldn't make it go she "" felt too bad "". Patient has had previous pneumonia and the abnormalities have been cited on her chest x-ray in the past. She denies any high fever, calf tenderness or swelling.   Past Medical History  Diagnosis Date  . Asthma     There are no active problems to display for this patient.   Past Surgical History  Procedure Laterality Date  . Adnoids removed      Past Surgical History  Procedure Laterality Date  . Adnoids removed      Current Outpatient Rx  Name  Route  Sig  Dispense  Refill  . acetaminophen (TYLENOL) 500 MG tablet   Oral   Take 1,000 mg by mouth every 6 (six) hours as needed for mild pain or moderate pain.         Marland Kitchen albuterol (PROVENTIL HFA;VENTOLIN HFA) 108 (90 BASE) MCG/ACT inhaler   Inhalation   Inhale 2 puffs into the lungs every 6 (six) hours as needed for wheezing or shortness of breath.   1 Inhaler   2   . chlorpheniramine-HYDROcodone (TUSSIONEX PENNKINETIC ER) 10-8 MG/5ML SUER   Oral   Take 5 mLs by mouth every 12 (twelve) hours as needed for cough.  140 mL   0   . guaiFENesin (MUCINEX) 600 MG 12 hr tablet   Oral   Take 1 tablet (600 mg total) by mouth 2 (two) times daily.   60 tablet   0   . LORazepam (ATIVAN) 1 MG tablet   Oral   Take 1 tablet (1 mg total) by mouth every 8 (eight) hours as needed for anxiety.   15 tablet   0   . predniSONE (DELTASONE) 20 MG tablet   Oral   Take 1 tablet (20 mg total) by mouth daily.   5 tablet   0   . traMADol (ULTRAM) 50 MG tablet   Oral   Take 1 tablet (50 mg total) by mouth every 6 (six) hours as needed.   20 tablet   0     Allergies:  Sulfa antibiotics  Family History: No family history on file.  Social History: Social History  Substance Use Topics  . Smoking status: Current Every Day Smoker  . Smokeless tobacco: Never Used  . Alcohol Use: No     Review of Systems:   10 point review of systems was performed and was otherwise negative:  Constitutional: No fever Eyes: No visual disturbances ENT: No sore throat, ear pain Cardiac: No chest pain Respiratory: No shortness of breath, wheezing, or stridor Abdomen: No abdominal pain, no vomiting, No diarrhea Endocrine: No weight  loss, No night sweats Extremities: No peripheral edema, cyanosis Skin: No rashes, easy bruising Neurologic: No focal weakness, trouble with speech or swollowing Urologic: No dysuria, Hematuria, or urinary frequency   Physical Exam:  ED Triage Vitals  Enc Vitals Group     BP 03/12/15 1021 111/77 mmHg     Pulse Rate 03/12/15 1021 133     Resp 03/12/15 1021 26     Temp 03/12/15 1021 97.7 F (36.5 C)     Temp Source 03/12/15 1021 Oral     SpO2 03/12/15 1022 99 %     Weight 03/12/15 1021 100 lb (45.36 kg)     Height 03/12/15 1021 4\' 11"  (1.499 m)     Head Cir --      Peak Flow --      Pain Score 03/12/15 1036 9     Pain Loc --      Pain Edu? --      Excl. in GC? --     General: Awake , Alert , and Oriented times 3; GCS 15 Head: Normal cephalic , atraumatic Eyes: Pupils equal ,  round, reactive to light Nose/Throat: No nasal drainage, patent upper airway without erythema or exudate.  Neck: Supple, Full range of motion, No anterior adenopathy or palpable thyroid masses Lungs: Clear to ascultation without wheezes , rhonchi, or rales Heart: Regular rate, regular rhythm without murmurs , gallops , or rubs Abdomen: Soft, non tender without rebound, guarding , or rigidity; bowel sounds positive and symmetric in all 4 quadrants. No organomegaly .        Extremities: 2 plus symmetric pulses. No edema, clubbing or cyanosis Neurologic: normal ambulation, Motor symmetric without deficits, sensory intact Skin: warm, dry, no rashes Patient's pain is reproduced with palpation toward the left axillary area. Palpation here does replicate the patient's discomfort.  Labs:   All laboratory work was reviewed including any pertinent negatives or positives listed below:  Labs Reviewed  CBC WITH DIFFERENTIAL/PLATELET - Abnormal; Notable for the following:    Neutro Abs 8.2 (*)    All other components within normal limits  COMPREHENSIVE METABOLIC PANEL - Abnormal; Notable for the following:    Chloride 112 (*)    CO2 19 (*)    Glucose, Bld 115 (*)    ALT 12 (*)    All other components within normal limits  FIBRIN DERIVATIVES D-DIMER ([facility] ONLY)    EAV:WUJWEKG:Felt not necessary since pain patient's pain was reproducible  Radiology:   EXAM: CHEST 2 VIEW  COMPARISON: 02/22/2015 and prior chest radiographs  FINDINGS: The cardiomediastinal silhouette is unremarkable.  A mild pectus excavatum deformity is noted.  There is no evidence of focal airspace disease, pulmonary edema, suspicious pulmonary nodule/mass, pleural effusion, or pneumothorax. No acute bony abnormalities are identified.  IMPRESSION: No active cardiopulmonary disease. * I personally reviewed the radiologic studies    ED Course: * The patient's pain seems to be reproducible and she was started on Toradol  given a bolus of steroids here in emergency department. Pain is reproducible over the left axillary area without any overlying rashes or lesions. I felt this was unlikely to be a life-threatening cause for her pain both from a history and also objective findings. Patient was advised continue with her follow-up with her pulmonologist and I felt further antibiotics at this time would be noncontributory. Differential includes all life-threatening causes for chest pain. This includes but is not exclusive to acute coronary syndrome, aortic dissection, pulmonary embolism, cardiac tamponade, community-acquired  pneumonia, pericarditis, musculoskeletal chest wall pain, etc.    Assessment:  Chest wall pain History of bronchiectasis  Final Clinical Impression:  Final diagnoses:  Left-sided chest wall pain     Plan:  Outpatient management Patient was advised to return immediately if condition worsens. Patient was advised to follow up with her primary care physician or other specialized physicians involved and in their current assessment.             Jennye Moccasin, MD 03/12/15 [phone]

## 2015-03-12 NOTE — ED Notes (Signed)
Pt with shortness of breath and cough for one week. Recently dx with bronchitis and PNE

## 2015-03-12 NOTE — ED Notes (Signed)
Patient transported to X-ray 

## 2015-03-12 NOTE — ED Notes (Signed)
Xray contacted in regards to UA preg not done prior to scan. Patient will be shielded during scan.

## 2015-03-13 ENCOUNTER — Other Ambulatory Visit: Payer: Self-pay | Admitting: Specialist

## 2015-03-13 ENCOUNTER — Ambulatory Visit
Admission: RE | Admit: 2015-03-13 | Discharge: 2015-03-13 | Disposition: A | Discharge status: Home / Self Care | Payer: Self-pay | Source: Ambulatory Visit | Attending: Specialist | Admitting: Specialist

## 2015-03-13 DIAGNOSIS — R918 Other nonspecific abnormal finding of lung field: Secondary | ICD-10-CM | POA: Insufficient documentation

## 2015-03-13 DIAGNOSIS — R0781 Pleurodynia: Secondary | ICD-10-CM

## 2015-03-13 DIAGNOSIS — S2232XA Fracture of one rib, left side, initial encounter for closed fracture: Secondary | ICD-10-CM | POA: Insufficient documentation

## 2015-03-13 MED ORDER — IOHEXOL 350 MG/ML SOLN
75.0000 mL | Freq: Once | INTRAVENOUS | Status: AC | PRN
  Administered 2015-03-13: 75 mL via INTRAVENOUS

## 2015-08-14 ENCOUNTER — Emergency Department
Admission: EM | Admit: 2015-08-14 | Discharge: 2015-08-14 | Disposition: A | Discharge status: Home / Self Care | Payer: Self-pay | Attending: Student | Admitting: Student

## 2015-08-14 ENCOUNTER — Encounter: Payer: Self-pay | Admitting: Medical Oncology

## 2015-08-14 DIAGNOSIS — J45909 Unspecified asthma, uncomplicated: Secondary | ICD-10-CM | POA: Insufficient documentation

## 2015-08-14 DIAGNOSIS — F17200 Nicotine dependence, unspecified, uncomplicated: Secondary | ICD-10-CM | POA: Insufficient documentation

## 2015-08-14 DIAGNOSIS — J029 Acute pharyngitis, unspecified: Secondary | ICD-10-CM | POA: Insufficient documentation

## 2015-08-14 DIAGNOSIS — J039 Acute tonsillitis, unspecified: Secondary | ICD-10-CM

## 2015-08-14 DIAGNOSIS — Z79899 Other long term (current) drug therapy: Secondary | ICD-10-CM | POA: Insufficient documentation

## 2015-08-14 DIAGNOSIS — Z7952 Long term (current) use of systemic steroids: Secondary | ICD-10-CM | POA: Insufficient documentation

## 2015-08-14 LAB — POCT RAPID STREP A: STREPTOCOCCUS, GROUP A SCREEN (DIRECT): NEGATIVE

## 2015-08-14 MED ORDER — AMOXICILLIN 500 MG PO TABS: 500.0000 mg | ORAL_TABLET | Freq: Three times a day (TID) | ORAL | Status: DC

## 2015-08-14 MED ORDER — LIDOCAINE VISCOUS 2 % MT SOLN: 20.0000 mL | OROMUCOSAL | Status: AC | PRN

## 2015-08-14 NOTE — Discharge Instructions (Signed)
Pharyngitis °Pharyngitis is redness, pain, and swelling (inflammation) of your pharynx.  °CAUSES  °Pharyngitis is usually caused by infection. Most of the time, these infections are from viruses (viral) and are part of a cold. However, sometimes pharyngitis is caused by bacteria (bacterial). Pharyngitis can also be caused by allergies. Viral pharyngitis may be spread from person to person by coughing, sneezing, and personal items or utensils (cups, forks, spoons, toothbrushes). Bacterial pharyngitis may be spread from person to person by more intimate contact, such as kissing.  °SIGNS AND SYMPTOMS  °Symptoms of pharyngitis include:   °· Sore throat.   °· Tiredness (fatigue).   °· Low-grade fever.   °· Headache. °· Joint pain and muscle aches. °· Skin rashes. °· Swollen lymph nodes. °· Plaque-like film on throat or tonsils (often seen with bacterial pharyngitis). °DIAGNOSIS  °Your health care provider will ask you questions about your illness and your symptoms. Your medical history, along with a physical exam, is often all that is needed to diagnose pharyngitis. Sometimes, a rapid strep test is done. Other lab tests may also be done, depending on the suspected cause.  °TREATMENT  °Viral pharyngitis will usually get better in 3-4 days without the use of medicine. Bacterial pharyngitis is treated with medicines that kill germs (antibiotics).  °HOME CARE INSTRUCTIONS  °· Drink enough water and fluids to keep your urine clear or pale yellow.   °· Only take over-the-counter or prescription medicines as directed by your health care provider:   °· If you are prescribed antibiotics, make sure you finish them even if you start to feel better.   °· Do not take aspirin.   °· Get lots of rest.   °· Gargle with 8 oz of salt water (½ tsp of salt per 1 qt of water) as often as every 1-2 hours to soothe your throat.   °· Throat lozenges (if you are not at risk for choking) or sprays may be used to soothe your throat. °SEEK MEDICAL  CARE IF:  °· You have large, tender lumps in your neck. °· You have a rash. °· You cough up green, yellow-brown, or bloody spit. °SEEK IMMEDIATE MEDICAL CARE IF:  °· Your neck becomes stiff. °· You drool or are unable to swallow liquids. °· You vomit or are unable to keep medicines or liquids down. °· You have severe pain that does not go away with the use of recommended medicines. °· You have trouble breathing (not caused by a stuffy nose). °MAKE SURE YOU:  °· Understand these instructions. °· Will watch your condition. °· Will get help right away if you are not doing well or get worse. °  °This information is not intended to replace advice given to you by your health care provider. Make sure you discuss any questions you have with your health care provider. °  °Document Released: 05/06/2005 Document Revised: 02/24/2013 Document Reviewed: 01/11/2013 °Elsevier Interactive Patient Education ©2016 [employer] °Tonsillitis °Tonsillitis is an infection of the throat that causes the tonsils to become red, tender, and swollen. Tonsils are collections of lymphoid tissue at the back of the throat. Each tonsil has crevices (crypts). Tonsils help fight nose and throat infections and keep infection from spreading to other parts of the body for the first 18 months of life.  °CAUSES °Sudden (acute) tonsillitis is usually caused by infection with streptococcal bacteria. Long-lasting (chronic) tonsillitis occurs when the crypts of the tonsils become filled with pieces of food and bacteria, which makes it easy for the tonsils to become repeatedly infected. °SYMPTOMS  °Symptoms   Symptoms of tonsillitis include:  A sore throat, with possible difficulty swallowing.  White patches on the tonsils.  Fever.  Tiredness.  New episodes of snoring during sleep, when you did not snore before.  Small, foul-smelling, yellowish-white pieces of material (tonsilloliths) that you occasionally cough up or spit out. The tonsilloliths can also  cause you to have bad breath. DIAGNOSIS Tonsillitis can be diagnosed through a physical exam. Diagnosis can be confirmed with the results of lab tests, including a throat culture. TREATMENT  The goals of tonsillitis treatment include the reduction of the severity and duration of symptoms and prevention of associated conditions. Symptoms of tonsillitis can be improved with the use of steroids to reduce the swelling. Tonsillitis caused by bacteria can be treated with antibiotic medicines. Usually, treatment with antibiotic medicines is started before the cause of the tonsillitis is known. However, if it is determined that the cause is not bacterial, antibiotic medicines will not treat the tonsillitis. If attacks of tonsillitis are severe and frequent, your health care provider may recommend surgery to remove the tonsils (tonsillectomy). HOME CARE INSTRUCTIONS   Rest as much as possible and get plenty of sleep.  Drink plenty of fluids. While the throat is very sore, eat soft foods or liquids, such as sherbet, soups, or instant breakfast drinks.  Eat frozen ice pops.  Gargle with a warm or cold liquid to help soothe the throat. Mix 1/4 teaspoon of salt and 1/4 teaspoon of baking soda in 8 oz of water. SEEK MEDICAL CARE IF:   Large, tender lumps develop in your neck.  A rash develops.  A green, yellow-brown, or bloody substance is coughed up.  You are unable to swallow liquids or food for 24 hours.  You notice that only one of the tonsils is swollen. SEEK IMMEDIATE MEDICAL CARE IF:   You develop any new symptoms such as vomiting, severe headache, stiff neck, chest pain, or trouble breathing or swallowing.  You have severe throat pain along with drooling or voice changes.  You have severe pain, unrelieved with recommended medications.  You are unable to fully open the mouth.  You develop redness, swelling, or severe pain anywhere in the neck.  You have a fever. MAKE SURE YOU:    Understand these instructions.  Will watch your condition.  Will get help right away if you are not doing well or get worse.   This information is not intended to replace advice given to you by your health care provider. Make sure you discuss any questions you have with your health care provider.   Document Released: 02/13/2005 Document Revised: 05/27/2014 Document Reviewed: 10/23/2012 Elsevier Interactive Patient Education [employer]2016 Elsevier Inc[employer]

## 2015-08-14 NOTE — ED Provider Notes (Signed)
[facility] Emergency Department Provider Note  ____________________________________________  Time seen: Approximately 12:15 PM  I have reviewed the triage vital signs and the nursing notes.   HISTORY  Chief Complaint Sore Throat    HPI Sarah Thompson is a 30 y.o. female who presents for a severe sore throat. Denies any difficulty breathing states that hurts tremendously to swallow. Complains of white pustular lesions on the back of the throat and uvula. Positive fever and chills at home. Describes her discomfort as an 8 out of 10 nonradiating symptoms ongoing for the last couple days. No relief with over-the-counter treatments.   Past Medical History  Diagnosis Date  . Asthma     There are no active problems to display for this patient.   Past Surgical History  Procedure Laterality Date  . Adnoids removed      Current Outpatient Rx  Name  Route  Sig  Dispense  Refill  . acetaminophen (TYLENOL) 500 MG tablet   Oral   Take 1,000 mg by mouth every 6 (six) hours as needed for mild pain or moderate pain.         Marland Kitchen. albuterol (PROVENTIL HFA;VENTOLIN HFA) 108 (90 BASE) MCG/ACT inhaler   Inhalation   Inhale 2 puffs into the lungs every 6 (six) hours as needed for wheezing or shortness of breath.   1 Inhaler   2   . amoxicillin (AMOXIL) 500 MG tablet   Oral   Take 1 tablet (500 mg total) by mouth 3 (three) times daily.   20 tablet   0   . chlorpheniramine-HYDROcodone (TUSSIONEX PENNKINETIC ER) 10-8 MG/5ML SUER   Oral   Take 5 mLs by mouth every 12 (twelve) hours as needed for cough.   140 mL   0   . guaiFENesin (MUCINEX) 600 MG 12 hr tablet   Oral   Take 1 tablet (600 mg total) by mouth 2 (two) times daily.   60 tablet   0   . lidocaine (XYLOCAINE) 2 % solution   Mouth/Throat   Use as directed 20 mLs in the mouth or throat as needed for mouth pain.   100 mL   0   . LORazepam (ATIVAN) 1 MG tablet   Oral   Take 1 tablet (1 mg  total) by mouth every 8 (eight) hours as needed for anxiety.   15 tablet   0   . predniSONE (DELTASONE) 20 MG tablet   Oral   Take 1 tablet (20 mg total) by mouth daily.   5 tablet   0   . traMADol (ULTRAM) 50 MG tablet   Oral   Take 1 tablet (50 mg total) by mouth every 6 (six) hours as needed.   20 tablet   0     Allergies Sulfa antibiotics  No family history on file.  Social History Social History  Substance Use Topics  . Smoking status: Current Every Day Smoker  . Smokeless tobacco: Never Used  . Alcohol Use: No    Review of Systems Constitutional: Positive fever/chills ENT: Positive sore throat. Cardiovascular: Denies chest pain. Respiratory: Denies shortness of breath. Musculoskeletal: Negative for back pain. Skin: Negative for rash. Neurological: Negative for headaches, focal weakness or numbness.  10-point ROS otherwise negative.  ____________________________________________   PHYSICAL EXAM:  VITAL SIGNS: ED Triage Vitals  Enc Vitals Group     BP 08/14/15 1036 112/71 mmHg     Pulse Rate 08/14/15 1036 100     Resp 08/14/15  1036 18     Temp 08/14/15 1036 99 F (37.2 C)     Temp Source 08/14/15 1036 Oral     SpO2 08/14/15 1036 98 %     Weight 08/14/15 1036 109 lb (49.442 kg)     Height 08/14/15 1036  (1.499 m)     Head Cir --      Peak Flow --      Pain Score 08/14/15 1037 8     Pain Loc --      Pain Edu? --      Excl. in GC? --     Constitutional: Alert and oriented. Well appearing and in no acute distress. Nose: No congestion/rhinnorhea. Mouth/Throat: Mucous membranes are moist.  Oropharynx non-erythematous. Neck: No stridor. Full range of motion no cervical adenopathy noted.  Cardiovascular: Normal rate, regular rhythm. Grossly normal heart sounds.  Good peripheral circulation. Respiratory: Normal respiratory effort.  No retractions. Lungs CTAB. Musculoskeletal: No lower extremity tenderness nor edema.  No joint  effusions. Neurologic:  Normal speech and language. No gross focal neurologic deficits are appreciated. No gait instability. Skin:  Skin is warm, dry and intact. No rash noted. Psychiatric: Mood and affect are normal. Speech and behavior are normal.  ____________________________________________   LABS (all labs ordered are listed, but only abnormal results are displayed)  Labs Reviewed  POCT RAPID STREP A   ____________________________________________    PROCEDURES  Procedure(s) performed: None  Critical Care performed: No  ____________________________________________   INITIAL IMPRESSION / ASSESSMENT AND PLAN / ED COURSE  Pertinent labs & imaging results that were available during my care of the patient were reviewed by me and considered in my medical decision making (see chart for details).  Acute exudative tonsillitis/pharyngitis. Rx given for Amoxil 500 mg 3 times a day, viscous lidocaine as needed. Patient follow-up with PCP or return to ER with any worsening symptomology. ____________________________________________   FINAL CLINICAL IMPRESSION(S) / ED DIAGNOSES  Final diagnoses:  Acute pharyngitis, unspecified pharyngitis type  Tonsillitis with exudate     This chart was dictated using voice recognition software/Dragon. Despite best efforts to proofread, errors can occur which can change the meaning. Any change was purely unintentional.   Evangeline Dakin, PA-C 08/14/15 1815  Gayla Doss, MD 08/15/15 2136

## 2015-08-14 NOTE — ED Notes (Signed)
Pt reports sore throat and fever since yesterday.

## 2016-07-11 ENCOUNTER — Emergency Department
Admission: EM | Admit: 2016-07-11 | Discharge: 2016-07-11 | Disposition: A | Discharge status: Home / Self Care | Payer: Self-pay | Attending: Emergency Medicine | Admitting: Emergency Medicine

## 2016-07-11 DIAGNOSIS — Y939 Activity, unspecified: Secondary | ICD-10-CM | POA: Insufficient documentation

## 2016-07-11 DIAGNOSIS — Z79899 Other long term (current) drug therapy: Secondary | ICD-10-CM | POA: Insufficient documentation

## 2016-07-11 DIAGNOSIS — J45909 Unspecified asthma, uncomplicated: Secondary | ICD-10-CM | POA: Insufficient documentation

## 2016-07-11 DIAGNOSIS — Y929 Unspecified place or not applicable: Secondary | ICD-10-CM | POA: Insufficient documentation

## 2016-07-11 DIAGNOSIS — S60461A Insect bite (nonvenomous) of left index finger, initial encounter: Secondary | ICD-10-CM | POA: Insufficient documentation

## 2016-07-11 DIAGNOSIS — Y999 Unspecified external cause status: Secondary | ICD-10-CM | POA: Insufficient documentation

## 2016-07-11 DIAGNOSIS — F17200 Nicotine dependence, unspecified, uncomplicated: Secondary | ICD-10-CM | POA: Insufficient documentation

## 2016-07-11 DIAGNOSIS — W57XXXA Bitten or stung by nonvenomous insect and other nonvenomous arthropods, initial encounter: Secondary | ICD-10-CM | POA: Insufficient documentation

## 2016-07-11 MED ORDER — DIPHENHYDRAMINE HCL 25 MG PO CAPS: 25.0000 mg | ORAL_CAPSULE | ORAL | 0 refills | Status: AC | PRN

## 2016-07-11 MED ORDER — RANITIDINE HCL 150 MG PO TABS: 150.0000 mg | ORAL_TABLET | Freq: Two times a day (BID) | ORAL | 0 refills | Status: AC
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
## 2016-07-11 NOTE — ED Provider Notes (Signed)
[facility] Emergency Department Provider Note  ____________________________________________  Time seen: Approximately 10:46 AM  I have reviewed the triage vital signs and the nursing notes.   HISTORY  Chief Complaint Insect Bite    HPI Sarah Thompson is a 31 y.o. female emergency department with 2 insect bites onleft hand. Patient states that she woke up with bites on her hand yesterday morning. Patient states that it initially started to swell but she took Benadryl and the swelling went down. Patient is here today because she is concerned that she was bit by a poisonous bug or that she has something that will spread to her children. She states that last night she developed some left-sided neck pain. Patient states that she could have pulled a muscle in her neck. Patient denies seeing any spiders or insects. Patient only goes outside to feed the dogs and patient has not noticed any ticks and has not pulled any ticks off of her skin.Patient is not having any difficulty moving fingers or thumb and is not having any sensation changes. No recent illness.  She has not taken anything today for symptoms.  Patient denies fever, headache, shortness of breath, chest pain, nausea, vomiting, abdominal pain.   Past Medical History:  Diagnosis Date  . Asthma     There are no active problems to display for this patient.   Past Surgical History:  Procedure Laterality Date  . adnoids removed      Prior to Admission medications   Medication Sig Start Date End Date Taking? Authorizing Provider  albuterol (PROVENTIL HFA;VENTOLIN HFA) 108 (90 BASE) MCG/ACT inhaler Inhale 2 puffs into the lungs every 6 (six) hours as needed for wheezing or shortness of breath. 02/12/15  Yes Cari B Triplett, FNP  buprenorphine-naloxone (SUBOXONE) 8-2 mg SUBL SL tablet Place 1 tablet under the tongue 3 (three) times daily.   Yes Historical Provider, MD  sertraline (ZOLOFT) 100 MG tablet Take 100  mg by mouth daily.   Yes Historical Provider, MD  acetaminophen (TYLENOL) 500 MG tablet Take 1,000 mg by mouth every 6 (six) hours as needed for mild pain or moderate pain.    Historical Provider, MD  amoxicillin (AMOXIL) 500 MG tablet Take 1 tablet (500 mg total) by mouth 3 (three) times daily. 08/14/15   Evangeline Dakinharles M Beers, PA-C  chlorpheniramine-HYDROcodone (TUSSIONEX PENNKINETIC ER) 10-8 MG/5ML SUER Take 5 mLs by mouth every 12 (twelve) hours as needed for cough. 02/22/15   Chinita Pesterari B Triplett, FNP  diphenhydrAMINE (BENADRYL) 25 mg capsule Take 1 capsule (25 mg total) by mouth every 4 (four) hours as needed. 07/11/16 07/18/16  Enid DerryAshley Ariyan Brisendine, PA-C  lidocaine (XYLOCAINE) 2 % solution Use as directed 20 mLs in the mouth or throat as needed for mouth pain. 08/14/15   Charmayne Sheerharles M Beers, PA-C  ranitidine (ZANTAC) 150 MG tablet Take 1 tablet (150 mg total) by mouth 2 (two) times daily. 07/11/16 07/18/16  Enid DerryAshley Tallyn Holroyd, PA-C  traMADol (ULTRAM) 50 MG tablet Take 1 tablet (50 mg total) by mouth every 6 (six) hours as needed. 03/12/15   Jennye MoccasinBrian S Quigley, MD    Allergies Sulfa antibiotics  No family history on file.  Social History Social History  Substance Use Topics  . Smoking status: Current Every Day Smoker  . Smokeless tobacco: Never Used  . Alcohol use No     Review of Systems  Constitutional: No fever/chills ENT: No upper respiratory complaints. Cardiovascular: No chest pain. Respiratory: No cough. No SOB. Gastrointestinal: No  abdominal pain.  No nausea, no vomiting.  Musculoskeletal: Negative for musculoskeletal pain. Skin: Negative for abrasions, lacerations, ecchymosis. Neurological: Negative for headaches, numbness or tingling   ____________________________________________   PHYSICAL EXAM:  VITAL SIGNS: ED Triage Vitals  Enc Vitals Group     BP 07/11/16 0928 126/85     Pulse Rate 07/11/16 0928 88     Resp 07/11/16 0928 17     Temp 07/11/16 0928 98.3 F (36.8 C)     Temp Source  07/11/16 0928 Oral     SpO2 07/11/16 0928 97 %     Weight 07/11/16 0930 130 lb (59 kg)     Height 07/11/16 0930 5\' 2"  (1.575 m)     Head Circumference --      Peak Flow --      Pain Score 07/11/16 0930 5     Pain Loc --      Pain Edu? --      Excl. in GC? --      Constitutional: Alert and oriented. Well appearing and in no acute distress. Eyes: Conjunctivae are normal. PERRL. EOMI. Head: Atraumatic. ENT:      Ears:      Nose: No congestion/rhinnorhea.      Mouth/Throat: Mucous membranes are moist.  Neck: No stridor.  No cervical spine tenderness to palpation. Negative Kernig and Brudzinskis sign. Cardiovascular: Normal rate, regular rhythm.  Good peripheral circulation. 2+ radial pulses. Respiratory: Normal respiratory effort without tachypnea or retractions. Lungs CTAB. Good air entry to the bases with no decreased or absent breath sounds.  Musculoskeletal: Full range of motion to all extremities. Full range of motion to fingers. No tenderness to palpation over hand or fingers. No gross deformities appreciated. Neurologic:  Normal speech and language. No gross focal neurologic deficits are appreciated.  Skin:  Skin is warm, dry and intact. 1 pinpoint area of erythema on left index finger. 1 pinpoint area of erythema at base of left index finger. No swelling or extending area of erythema. Psychiatric: Mood and affect are normal. Speech and behavior are normal. Patient exhibits appropriate insight and judgement.   ____________________________________________   LABS (all labs ordered are listed, but only abnormal results are displayed)  Labs Reviewed - No data to display ____________________________________________  EKG   ____________________________________________  RADIOLOGY   No results found.  ____________________________________________    PROCEDURES  Procedure(s) performed:    Procedures    Medications - No data to  display   ____________________________________________   INITIAL IMPRESSION / ASSESSMENT AND PLAN / ED COURSE  Pertinent labs & imaging results that were available during my care of the patient were reviewed by me and considered in my medical decision making (see chart for details).  Review of the [place] CSRS was performed in accordance of the [facility] prior to dispensing any controlled drugs.     Patient's diagnosis is consistent with insect bite. Vital signs and exam are reassuring. No indication of infection. Education about antibiotics and return precautions for antibiotics were given. Patient did not see any ticks or spiders. All questions were answered. Patient will be discharged home with prescriptions for ranitidine and benadryl. Patient is to follow up with PCP as directed. Patient is given ED precautions to return to the ED for any worsening or new symptoms.     ____________________________________________  FINAL CLINICAL IMPRESSION(S) / ED DIAGNOSES  Final diagnoses:  Insect bite, initial encounter      NEW MEDICATIONS STARTED DURING THIS VISIT:  Discharge Medication List  as of 07/11/2016 10:54 AM    START taking these medications   Details  diphenhydrAMINE (BENADRYL) 25 mg capsule Take 1 capsule (25 mg total) by mouth every 4 (four) hours as needed., Starting Thu 07/11/2016, Until Thu 07/18/2016, Print    ranitidine (ZANTAC) 150 MG tablet Take 1 tablet (150 mg total) by mouth 2 (two) times daily., Starting Thu 07/11/2016, Until Thu 07/18/2016, Print            This chart was dictated using voice recognition software/Dragon. Despite best efforts to proofread, errors can occur which can change the meaning. Any change was purely unintentional.    Enid Derry, PA-C 07/11/16 1111    Sharman Cheek, MD 07/16/16 [phone]

## 2016-07-11 NOTE — ED Notes (Signed)
Woke up yesterday with 2 bites on left hand and swelling.  [place].  Says the swelling has gone down some, but it continues to ache and now neck hurts as well.  Unsure what bit her.

## 2017-01-16 ENCOUNTER — Emergency Department: Payer: Self-pay

## 2017-01-16 ENCOUNTER — Emergency Department
Admission: EM | Admit: 2017-01-16 | Discharge: 2017-01-16 | Disposition: A | Discharge status: Home / Self Care | Payer: Self-pay | Attending: Student in an Organized Health Care Education/Training Program | Admitting: Student in an Organized Health Care Education/Training Program

## 2017-01-16 ENCOUNTER — Encounter: Payer: Self-pay | Admitting: Emergency Medicine

## 2017-01-16 DIAGNOSIS — Z79899 Other long term (current) drug therapy: Secondary | ICD-10-CM | POA: Insufficient documentation

## 2017-01-16 DIAGNOSIS — R112 Nausea with vomiting, unspecified: Secondary | ICD-10-CM | POA: Insufficient documentation

## 2017-01-16 DIAGNOSIS — J45909 Unspecified asthma, uncomplicated: Secondary | ICD-10-CM | POA: Insufficient documentation

## 2017-01-16 DIAGNOSIS — R10811 Right upper quadrant abdominal tenderness: Secondary | ICD-10-CM | POA: Insufficient documentation

## 2017-01-16 DIAGNOSIS — R935 Abnormal findings on diagnostic imaging of other abdominal regions, including retroperitoneum: Secondary | ICD-10-CM | POA: Insufficient documentation

## 2017-01-16 DIAGNOSIS — R109 Unspecified abdominal pain: Secondary | ICD-10-CM

## 2017-01-16 DIAGNOSIS — N83209 Unspecified ovarian cyst, unspecified side: Secondary | ICD-10-CM | POA: Insufficient documentation

## 2017-01-16 DIAGNOSIS — F17210 Nicotine dependence, cigarettes, uncomplicated: Secondary | ICD-10-CM | POA: Insufficient documentation

## 2017-01-16 LAB — COMPREHENSIVE METABOLIC PANEL
ALBUMIN: 4.5 g/dL (ref 3.5–5.0)
ALT: 10 U/L — AB (ref 14–54)
AST: 21 U/L (ref 15–41)
Alkaline Phosphatase: 60 U/L (ref 38–126)
Anion gap: 10 (ref 5–15)
BUN: 10 mg/dL (ref 6–20)
CALCIUM: 9.2 mg/dL (ref 8.9–10.3)
CO2: 20 mmol/L — ABNORMAL LOW (ref 22–32)
Chloride: 106 mmol/L (ref 101–111)
Creatinine, Ser: 0.52 mg/dL (ref 0.44–1.00)
GFR calc Af Amer: >60 mL/min (ref >60)
Glucose, Bld: 120 mg/dL — ABNORMAL HIGH (ref 65–99)
Potassium: 2.5 mmol/L — CL (ref 3.5–5.1)
SODIUM: 136 mmol/L (ref 135–145)
TOTAL PROTEIN: 7.3 g/dL (ref 6.5–8.1)
Total Bilirubin: 0.5 mg/dL (ref 0.3–1.2)

## 2017-01-16 LAB — URINALYSIS, COMPLETE (UACMP) WITH MICROSCOPIC
Bilirubin Urine: NEGATIVE
GLUCOSE, UA: NEGATIVE mg/dL
Hgb urine dipstick: NEGATIVE
Ketones, ur: 5 mg/dL — AB
Leukocytes, UA: NEGATIVE
NITRITE: NEGATIVE
Protein, ur: 30 mg/dL — AB
SPECIFIC GRAVITY, URINE: 1.012 (ref 1.005–1.030)
pH: 6 (ref 5.0–8.0)

## 2017-01-16 LAB — CBC
HCT: 33.5 % — ABNORMAL LOW (ref 35.0–47.0)
Hemoglobin: 11.8 g/dL — ABNORMAL LOW (ref 12.0–16.0)
MCH: 31.4 pg (ref 26.0–34.0)
MCHC: 35 g/dL (ref 32.0–36.0)
MCV: 89.5 fL (ref 80.0–100.0)
Platelets: 261 10*3/uL (ref 150–440)
RBC: 3.75 MIL/uL — ABNORMAL LOW (ref 3.80–5.20)
RDW: 13.5 % (ref 11.5–14.5)
WBC: 9.9 10*3/uL (ref 3.6–11.0)

## 2017-01-16 LAB — POCT PREGNANCY, URINE: PREG TEST UR: NEGATIVE

## 2017-01-16 LAB — LIPASE, BLOOD: LIPASE: 19 U/L (ref 11–51)

## 2017-01-16 MED ORDER — POTASSIUM CHLORIDE 10 MEQ/100ML IV SOLN
10.0000 meq | INTRAVENOUS | Status: AC
  Administered 2017-01-16 (×2): 10 meq via INTRAVENOUS
  Filled 2017-01-16 (×2): qty 100

## 2017-01-16 MED ORDER — IOPAMIDOL (ISOVUE-300) INJECTION 61%
100.0000 mL | Freq: Once | INTRAVENOUS | Status: AC | PRN
  Administered 2017-01-16: 100 mL via INTRAVENOUS
  Filled 2017-01-16: qty 100

## 2017-01-16 MED ORDER — PROMETHAZINE HCL 25 MG/ML IJ SOLN
12.5000 mg | Freq: Four times a day (QID) | INTRAMUSCULAR | Status: DC | PRN
  Administered 2017-01-16: 12.5 mg via INTRAVENOUS
  Filled 2017-01-16: qty 1

## 2017-01-16 MED ORDER — ONDANSETRON 4 MG PO TBDP
4.0000 mg | ORAL_TABLET | Freq: Once | ORAL | Status: AC | PRN
  Administered 2017-01-16: 4 mg via ORAL
  Filled 2017-01-16: qty 1

## 2017-01-16 MED ORDER — SODIUM CHLORIDE 0.9 % IV BOLUS (SEPSIS)
1000.0000 mL | Freq: Once | INTRAVENOUS | Status: AC
  Administered 2017-01-16: 1000 mL via INTRAVENOUS

## 2017-01-16 NOTE — ED Notes (Signed)
Patient transported to Ultrasound 

## 2017-01-16 NOTE — ED Notes (Signed)
Pharmacy notified to send IV potassium dose.

## 2017-01-16 NOTE — ED Triage Notes (Signed)
Pt comes into the ED via POV c/o abdominal pain that started yesterday.  Patient thought it was constipation and gave herself an enema.  Patient states after the enema she has been having cold sweats, intense pain with no relief, and N/V.  Patient has even and unlabored respirations but presents tearful in the triage room at this time.

## 2017-01-16 NOTE — ED Notes (Signed)
Date and time results received: 01/16/17 4:24 PM  Test: Potassium Critical Value: 2.5  Name of Provider Notified: Dr. Roxan Hockeyobinson  Orders Received? Or Actions Taken?: Dr. Roxan Hockeyobinson notified and acknowledged at this time.

## 2017-01-16 NOTE — ED Provider Notes (Signed)
[facility] Emergency Department Provider Note    First MD Initiated Contact with Patient 01/16/17 1637     (approximate)  I have reviewed the triage vital signs and the nursing notes.   HISTORY  Chief Complaint Abdominal Pain    HPI Sarah Thompson is a 31 y.o. female presents with chief complaint right upper quadrant abdominal pain associated with nausea and vomiting. Has had chills but no fevers. States that she initially felt the pain was related to constipation so she gave herself an enema has not had any improvement in her symptoms. No previous abdominal surgeries. At this point she states that the pain is moved all over her abdomen. Does not have any known history of gallstones. There is no relation of the pain to eating.Denies any vaginal discharge or vaginal bleeding. No discomfort with urination.   Past Medical History:  Diagnosis Date  . Asthma    No family history on file. Past Surgical History:  Procedure Laterality Date  . adnoids removed     There are no active problems to display for this patient.     Prior to Admission medications   Medication Sig Start Date End Date Taking? Authorizing Provider  acetaminophen (TYLENOL) 500 MG tablet Take 1,000 mg by mouth every 6 (six) hours as needed for mild pain or moderate pain.    [provider]  albuterol (PROVENTIL HFA;VENTOLIN HFA) 108 (90 BASE) MCG/ACT inhaler Inhale 2 puffs into the lungs every 6 (six) hours as needed for wheezing or shortness of breath. 02/12/15   Triplett, Rulon Eisenmenger B, FNP  amoxicillin (AMOXIL) 500 MG tablet Take 1 tablet (500 mg total) by mouth 3 (three) times daily. 08/14/15   Beers, Charmayne Sheer, PA-C  buprenorphine-naloxone (SUBOXONE) 8-2 mg SUBL SL tablet Place 1 tablet under the tongue 3 (three) times daily.    [provider]  chlorpheniramine-HYDROcodone (TUSSIONEX PENNKINETIC ER) 10-8 MG/5ML SUER Take 5 mLs by mouth every 12 (twelve) hours as needed for  cough. 02/22/15   Triplett, Rulon Eisenmenger B, FNP  diphenhydrAMINE (BENADRYL) 25 mg capsule Take 1 capsule (25 mg total) by mouth every 4 (four) hours as needed. 07/11/16 07/18/16  Enid Derry, PA-C  lidocaine (XYLOCAINE) 2 % solution Use as directed 20 mLs in the mouth or throat as needed for mouth pain. 08/14/15   Beers, Charmayne Sheer, PA-C  ranitidine (ZANTAC) 150 MG tablet Take 1 tablet (150 mg total) by mouth 2 (two) times daily. 07/11/16 07/18/16  Enid Derry, PA-C  sertraline (ZOLOFT) 100 MG tablet Take 100 mg by mouth daily.    [provider]  traMADol (ULTRAM) 50 MG tablet Take 1 tablet (50 mg total) by mouth every 6 (six) hours as needed. 03/12/15   Jennye Moccasin, MD    Allergies Sulfa antibiotics    Social History Social History  Substance Use Topics  . Smoking status: Current Every Day Smoker  . Smokeless tobacco: Never Used  . Alcohol use No    Review of Systems Patient denies headaches, rhinorrhea, blurry vision, numbness, shortness of breath, chest pain, edema, cough, abdominal pain, nausea, vomiting, diarrhea, dysuria, fevers, rashes or hallucinations unless otherwise stated above in HPI. ____________________________________________   PHYSICAL EXAM:  VITAL SIGNS: Vitals:   01/16/17 1551 01/16/17 2137  BP: 112/63 107/73  Pulse: 82 83  Resp: 20 16  Temp: 99.2 F (37.3 C)   SpO2: 100% 99%    Constitutional: Alert and oriented.  in no acute distress. Eyes: Conjunctivae are  normal.  Head: Atraumatic. Nose: No congestion/rhinnorhea. Mouth/Throat: Mucous membranes are moist.   Neck: No stridor. Painless ROM.  Cardiovascular: Normal rate, regular rhythm. Grossly normal heart sounds.  Good peripheral circulation. Respiratory: Normal respiratory effort.  No retractions. Lungs CTAB. Gastrointestinal: Soft with RUQ and epigastric ttp. No distention. No abdominal bruits. No CVA tenderness. Musculoskeletal: No lower extremity tenderness nor edema.  No joint  effusions. Neurologic:  Normal speech and language. No gross focal neurologic deficits are appreciated. No facial droop Skin:  Skin is warm, dry and intact. No rash noted. Psychiatric: Mood and affect are normal. Speech and behavior are normal.  ____________________________________________   LABS (all labs ordered are listed, but only abnormal results are displayed)  Results for orders placed or performed during the hospital encounter of 01/16/17 (from the past 24 hour(s))  Lipase, blood     Status: None   Collection Time: 01/16/17  3:49 PM  Result Value Ref Range   Lipase 19 11 - 51 U/L  Comprehensive metabolic panel     Status: Abnormal   Collection Time: 01/16/17  3:49 PM  Result Value Ref Range   Sodium 136 135 - 145 mmol/L   Potassium 2.5 (LL) 3.5 - 5.1 mmol/L   Chloride 106 101 - 111 mmol/L   CO2 20 (L) 22 - 32 mmol/L   Glucose, Bld 120 (H) 65 - 99 mg/dL   BUN 10 6 - 20 mg/dL   Creatinine, Ser 1.610.52 0.44 - 1.00 mg/dL   Calcium 9.2 8.9 - 09.610.3 mg/dL   Total Protein 7.3 6.5 - 8.1 g/dL   Albumin 4.5 3.5 - 5.0 g/dL   AST 21 15 - 41 U/L   ALT 10 (L) 14 - 54 U/L   Alkaline Phosphatase 60 38 - 126 U/L   Total Bilirubin 0.5 0.3 - 1.2 mg/dL   GFR calc non Af Amer >60 >60 mL/min   GFR calc Af Amer >60 >60 mL/min   Anion gap 10 5 - 15  CBC     Status: Abnormal   Collection Time: 01/16/17  3:49 PM  Result Value Ref Range   WBC 9.9 3.6 - 11.0 K/uL   RBC 3.75 (L) 3.80 - 5.20 MIL/uL   Hemoglobin 11.8 (L) 12.0 - 16.0 g/dL   HCT 04.533.5 (L) 40.935.0 - 81.147.0 %   MCV 89.5 80.0 - 100.0 fL   MCH 31.4 26.0 - 34.0 pg   MCHC 35.0 32.0 - 36.0 g/dL   RDW 91.413.5 78.211.5 - 95.614.5 %   Platelets 261 150 - 440 K/uL  Urinalysis, Complete w Microscopic     Status: Abnormal   Collection Time: 01/16/17  3:49 PM  Result Value Ref Range   Color, Urine YELLOW (A) YELLOW   APPearance HAZY (A) CLEAR   Specific Gravity, Urine 1.012 1.005 - 1.030   pH 6.0 5.0 - 8.0   Glucose, UA NEGATIVE NEGATIVE mg/dL   Hgb urine  dipstick NEGATIVE NEGATIVE   Bilirubin Urine NEGATIVE NEGATIVE   Ketones, ur 5 (A) NEGATIVE mg/dL   Protein, ur 30 (A) NEGATIVE mg/dL   Nitrite NEGATIVE NEGATIVE   Leukocytes, UA NEGATIVE NEGATIVE   RBC / HPF 0-5 0 - 5 RBC/hpf   WBC, UA 0-5 0 - 5 WBC/hpf   Bacteria, UA RARE (A) NONE SEEN   Squamous Epithelial / LPF 6-30 (A) NONE SEEN   Mucus PRESENT   Pregnancy, urine POC     Status: None   Collection Time: 01/16/17  6:30 PM  Result Value Ref Range   Preg Test, Ur NEGATIVE NEGATIVE   ____________________________________________  EKG____________________________________________  RADIOLOGY I personally reviewed all radiographic images ordered to evaluate for the above acute complaints and reviewed radiology reports and findings.  These findings were personally discussed with the patient.  Please see medical record for radiology report.  ____________________________________________   PROCEDURES  Procedure(s) performed:  Procedures    Critical Care performed: no ____________________________________________   INITIAL IMPRESSION / ASSESSMENT AND PLAN / ED COURSE  Pertinent labs & imaging results that were available during my care of the patient were reviewed by me and considered in my medical decision making (see chart for details).  DDX: cholecystitis, cholelithiasis, pancreatitis, gastritis, appy, colitis  Sarah Thompson is a 31 y.o. who presents to the ED with abdominal pain as described above. He shouldn't nontoxic appearing but describes pretty significant pain starting yesterday. Does not have any overt peritonitis but based on her presentation I do feel the patient will require CT imaging to further evaluate.  Have discussed with the patient and available family all diagnostics and treatments performed thus far and all questions were answered to the best of my ability. The patient demonstrates understanding and agreement with plan.   Clinical Course as of Jan 16 2226   Thu Jan 16, 2017  2036 Repeat abdominal exam is benign.  Patient states she feels "fine".  After discussion of CT findings with patient and do feel that she will require pelvic ultrasound to evaluate for possible source of hemorrhage of the pelvisand fluid.  Possible hemorrhagic cyst. Less consistent with perforation as there is no free air. Not an ectopic as she is not pregnant.  [PR]  2140 Patient is pain-free. She has no evidence of peritonitis. I had the patient get out of the bed and jump up and down and she has no discomfort. I discussed the CT and ultrasound findings with Dr. Dalbert Garnet of OB/GYN. Symptoms most likely secondary to ruptured hemorrhagic cyst. There is no evidence of free air in the abdomen or colonic inflammation to suggest perforation and also expect her pain to be more persistent. She has no evidence of acute blood loss anemia. She is tolerating oral hydration.  Patient received IV and oral potassium nd the patient has requested discharge home. At this point I do believe the patient is appropriate for close follow-up in OB/GYN clinic.  We discussed strict return precautions.  Have discussed with the patient and available family all diagnostics and treatments performed thus far and all questions were answered to the best of my ability. The patient demonstrates understanding and agreement with plan.   [PR]    Clinical Course User Index [PR] Willy Eddy, MD     ____________________________________________   FINAL CLINICAL IMPRESSION(S) / ED DIAGNOSES  Final diagnoses:  Abdominal pain  Abnormal CT scan, pelvis  Hemorrhagic ovarian cyst      NEW MEDICATIONS STARTED DURING THIS VISIT:  Discharge Medication List as of 01/16/2017  9:30 PM       Note:  This document was prepared using Dragon voice recognition software and may include unintentional dictation errors.    Willy Eddy, MD 01/16/17 2229

## 2017-01-16 NOTE — ED Notes (Signed)
Pt states she is unable to provide urine specimen at this time; will make another attempt post IV fluids.

## 2017-01-16 NOTE — ED Notes (Signed)
Patient transported to CT 

## 2017-01-16 NOTE — Discharge Instructions (Signed)

## 2017-01-17 ENCOUNTER — Emergency Department
Admission: EM | Admit: 2017-01-17 | Discharge: 2017-01-17 | Disposition: A | Discharge status: Home / Self Care | Payer: Self-pay | Attending: Emergency Medicine | Admitting: Emergency Medicine

## 2017-01-17 ENCOUNTER — Encounter: Payer: Self-pay | Admitting: Emergency Medicine

## 2017-01-17 DIAGNOSIS — J45909 Unspecified asthma, uncomplicated: Secondary | ICD-10-CM | POA: Insufficient documentation

## 2017-01-17 DIAGNOSIS — D649 Anemia, unspecified: Secondary | ICD-10-CM | POA: Insufficient documentation

## 2017-01-17 DIAGNOSIS — Z79899 Other long term (current) drug therapy: Secondary | ICD-10-CM | POA: Insufficient documentation

## 2017-01-17 DIAGNOSIS — F17210 Nicotine dependence, cigarettes, uncomplicated: Secondary | ICD-10-CM | POA: Insufficient documentation

## 2017-01-17 DIAGNOSIS — E876 Hypokalemia: Secondary | ICD-10-CM | POA: Insufficient documentation

## 2017-01-17 LAB — BASIC METABOLIC PANEL
ANION GAP: 7 (ref 5–15)
BUN: 7 mg/dL (ref 6–20)
CHLORIDE: 108 mmol/L (ref 101–111)
CO2: 24 mmol/L (ref 22–32)
Calcium: 8.3 mg/dL — ABNORMAL LOW (ref 8.9–10.3)
Creatinine, Ser: 0.42 mg/dL — ABNORMAL LOW (ref 0.44–1.00)
GFR calc Af Amer: >60 mL/min (ref >60)
GFR calc non Af Amer: >60 mL/min (ref >60)
Glucose, Bld: 111 mg/dL — ABNORMAL HIGH (ref 65–99)
Potassium: 2.8 mmol/L — ABNORMAL LOW (ref 3.5–5.1)
Sodium: 139 mmol/L (ref 135–145)

## 2017-01-17 LAB — CBC
HCT: 31 % — ABNORMAL LOW (ref 35.0–47.0)
Hemoglobin: 10.8 g/dL — ABNORMAL LOW (ref 12.0–16.0)
MCH: 32.1 pg (ref 26.0–34.0)
MCHC: 34.7 g/dL (ref 32.0–36.0)
MCV: 92.4 fL (ref 80.0–100.0)
Platelets: 207 10*3/uL (ref 150–440)
RBC: 3.36 MIL/uL — AB (ref 3.80–5.20)
RDW: 13.9 % (ref 11.5–14.5)
WBC: 5.5 10*3/uL (ref 3.6–11.0)

## 2017-01-17 MED ORDER — POTASSIUM CHLORIDE ER 10 MEQ PO TBCR: 10.0000 meq | EXTENDED_RELEASE_TABLET | Freq: Every day | ORAL | 0 refills | Status: AC

## 2017-01-17 NOTE — Discharge Instructions (Signed)
Please follow up with your primary care provider  on Tuesday as planned. Return to the ER for any symptom that changes or worsens if unable to schedule an appointment.

## 2017-01-17 NOTE — ED Triage Notes (Signed)
Patient returns for HGB check s/p ruptured hemorrhagic cyst at the request of Roxan Hockeyobinson MD. Patient has no complaints at this time

## 2017-01-17 NOTE — ED Provider Notes (Signed)
[facility] Emergency Department Provider Note  ____________________________________________  Time seen: Approximately 3:27 PM  I have reviewed the triage vital signs and the nursing notes.   HISTORY  Chief Complaint Labs Only   HPI Sarah Thompson is a 31 y.o. female who presents to the emergency department for recheck of her potassium and hemoglobin. She was evaluated here yesterday and found to be hypokalemic and mildly anemic. She requested discharge and was sent home with instructions to return for repeat blood work.She was here yesterday for severe abdominal pain and was diagnosed with a hemorrhagic ovarian cyst. Today, she states that her abdomen is sore like she has been doing sit ups but feels much better than it did yesterday. She was unable to schedule an appointment at [facility] and plans to see her family PCP on Tuesday.  Past Medical History:  Diagnosis Date  . Asthma     There are no active problems to display for this patient.   Past Surgical History:  Procedure Laterality Date  . adnoids removed      Prior to Admission medications   Medication Sig Start Date End Date Taking? Authorizing Provider  acetaminophen (TYLENOL) 500 MG tablet Take 1,000 mg by mouth every 6 (six) hours as needed for mild pain or moderate pain.    [provider]  albuterol (PROVENTIL HFA;VENTOLIN HFA) 108 (90 BASE) MCG/ACT inhaler Inhale 2 puffs into the lungs every 6 (six) hours as needed for wheezing or shortness of breath. 02/12/15   Penina Reisner, Rulon Eisenmenger B, FNP  amoxicillin (AMOXIL) 500 MG tablet Take 1 tablet (500 mg total) by mouth 3 (three) times daily. 08/14/15   Beers, Charmayne Sheer, PA-C  buprenorphine-naloxone (SUBOXONE) 8-2 mg SUBL SL tablet Place 1 tablet under the tongue 3 (three) times daily.    [provider]  chlorpheniramine-HYDROcodone (TUSSIONEX PENNKINETIC ER) 10-8 MG/5ML SUER Take 5 mLs by mouth every 12 (twelve) hours as needed for  cough. 02/22/15   Eudell Mcphee, Rulon Eisenmenger B, FNP  diphenhydrAMINE (BENADRYL) 25 mg capsule Take 1 capsule (25 mg total) by mouth every 4 (four) hours as needed. 07/11/16 07/18/16  Enid Derry, PA-C  lidocaine (XYLOCAINE) 2 % solution Use as directed 20 mLs in the mouth or throat as needed for mouth pain. 08/14/15   Beers, Charmayne Sheer, PA-C  potassium chloride (K-DUR) 10 MEQ tablet Take 1 tablet (10 mEq total) by mouth daily. 01/17/17   Providencia Hottenstein, Rulon Eisenmenger B, FNP  ranitidine (ZANTAC) 150 MG tablet Take 1 tablet (150 mg total) by mouth 2 (two) times daily. 07/11/16 07/18/16  Enid Derry, PA-C  sertraline (ZOLOFT) 100 MG tablet Take 100 mg by mouth daily.    [provider]  traMADol (ULTRAM) 50 MG tablet Take 1 tablet (50 mg total) by mouth every 6 (six) hours as needed. 03/12/15   Jennye Moccasin, MD    Allergies Sulfa antibiotics  History reviewed. No pertinent family history.  Social History Social History  Substance Use Topics  . Smoking status: Current Every Day Smoker  . Smokeless tobacco: Never Used  . Alcohol use No    Review of Systems Constitutional: Negative for fever. ENT: Negative for sore throat. Respiratory: Respirations even and unlabored Gastrointestinal: No abdominal pain.  No nausea, no vomiting.  No diarrhea.  Musculoskeletal: Negative for generalized body aches. Skin: Negative for rash/lesion/wound. Neurological: Negative for headaches, focal weakness or numbness.  ____________________________________________   PHYSICAL EXAM:  VITAL SIGNS: ED Triage Vitals  Enc Vitals Group  BP 01/17/17 1349 (!) 145/76     Pulse Rate 01/17/17 1349 90     Resp 01/17/17 1349 16     Temp 01/17/17 1349 99 F (37.2 C)     Temp Source 01/17/17 1349 Oral     SpO2 01/17/17 1349 99 %     Weight 01/17/17 1350 130 lb (59 kg)     Height 01/17/17 1350 4\' 11"  (1.499 m)     Head Circumference --      Peak Flow --      Pain Score 01/17/17 1347 0     Pain Loc --      Pain Edu? --       Excl. in GC? --     Constitutional: Alert and oriented. Well appearing and in no acute distress. Eyes: Conjunctivae are normal. PERRL. EOMI. Head: Atraumatic. Nose: No congestion/rhinnorhea. Mouth/Throat: Mucous membranes are moist. Neck: No stridor.  Cardiovascular: Normal rate, regular rhythm. Good peripheral circulation. Respiratory: Normal respiratory effort. Abdominal: Soft without rebound or guarding. Bowel sounds active and present x 4. Musculoskeletal: Full ROM throughout.  Neurologic:  Normal speech and language. No gross focal neurologic deficits are appreciated. Speech is normal. No gait instability. Skin:  Skin is warm, dry and intact. No rash noted. Psychiatric: Mood and affect are normal. Speech and behavior are normal.  ____________________________________________   LABS (all labs ordered are listed, but only abnormal results are displayed)  Labs Reviewed  CBC - Abnormal; Notable for the following:       Result Value   RBC 3.36 (*)    Hemoglobin 10.8 (*)    HCT 31.0 (*)    All other components within normal limits  BASIC METABOLIC PANEL - Abnormal; Notable for the following:    Potassium 2.8 (*)    Glucose, Bld 111 (*)    Creatinine, Ser 0.42 (*)    Calcium 8.3 (*)    All other components within normal limits   ____________________________________________  EKG  Not indicated. ____________________________________________  RADIOLOGY  Not indicated. ____________________________________________   PROCEDURES  None ____________________________________________   INITIAL IMPRESSION / ASSESSMENT AND PLAN / ED COURSE  31 year old female presenting to the emergency department for repeat labs. Her hemoglobin is 10.8 which is one point lower than yesterday. Her potassium is 2.8 today versus 2.5 yesterday. She will be given a prescription for 10 mEq of oral potassium to be taken daily for the next 5 days. She will then be able to see her primary care  provider. She was also advised to increase her potassium intake by foods.  Also, she states that she has been taking 6 ibuprofen every 6 hours due to dental pain while she awaits her next appointment. She is currently under the care of a dentist and does not need to be evaluated for the dental issue today. She was strongly advised to stop all NSAIDs and given specific examples. She was encouraged to take no more than 4 grams of Tylenol in a day. She also reports that she is on Suboxone for some issues, but has been clean for the past 2 years and doesn't want to take any chances with any controlled substances.   Since Monday is Labor Day, she was given strict ER return precautions if she begins to have abdominal pain or weakness before she can make it to her scheduled appointment.  Pertinent labs & imaging results that were available during my care of the patient were reviewed by me and considered in my medical  decision making (see chart for details). ____________________________________________   FINAL CLINICAL IMPRESSION(S) / ED DIAGNOSES  Final diagnoses:  Hypokalemia  Anemia, unspecified type       Chinita Pesterriplett, Ercilia Bettinger B, FNP 01/17/17 1603    Jene EveryKinner, Robert, MD 01/18/17 [phone]

## 2017-01-17 NOTE — ED Notes (Signed)
Sent for recheck of hemoglobin due to blood in stomach discovered yesterday in ER per patient. Pt denies the severe pain she experienced yesterday. No bleeding from vagina or rectum.

## 2017-03-25 IMAGING — CR DG CHEST 2V
2 series · 2 of 2 positions shown · non-contrast
Comparison: 06/26/2012

CLINICAL DATA: Cough, congestion, runny nose

EXAM:
CHEST  2 VIEW

[chest pa]
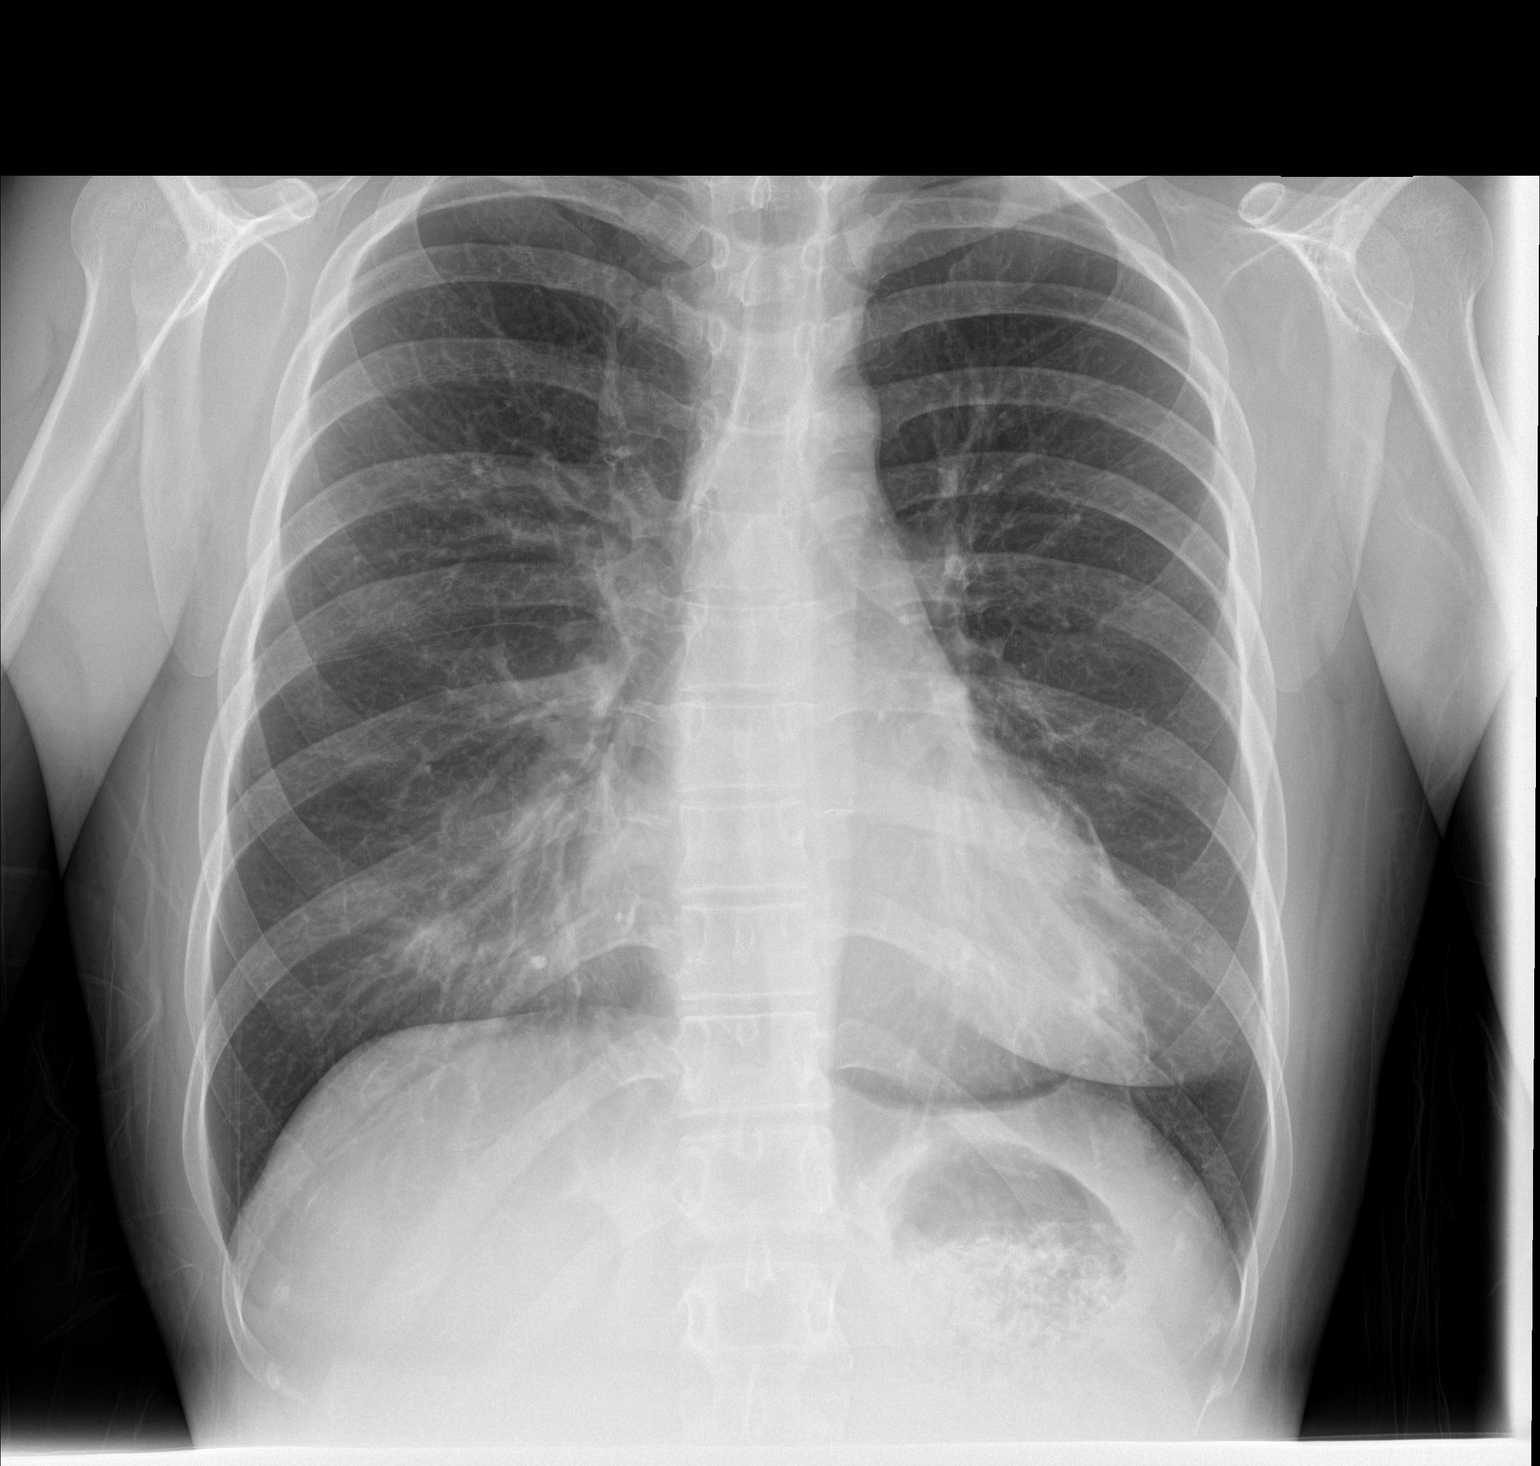

[chest lat]
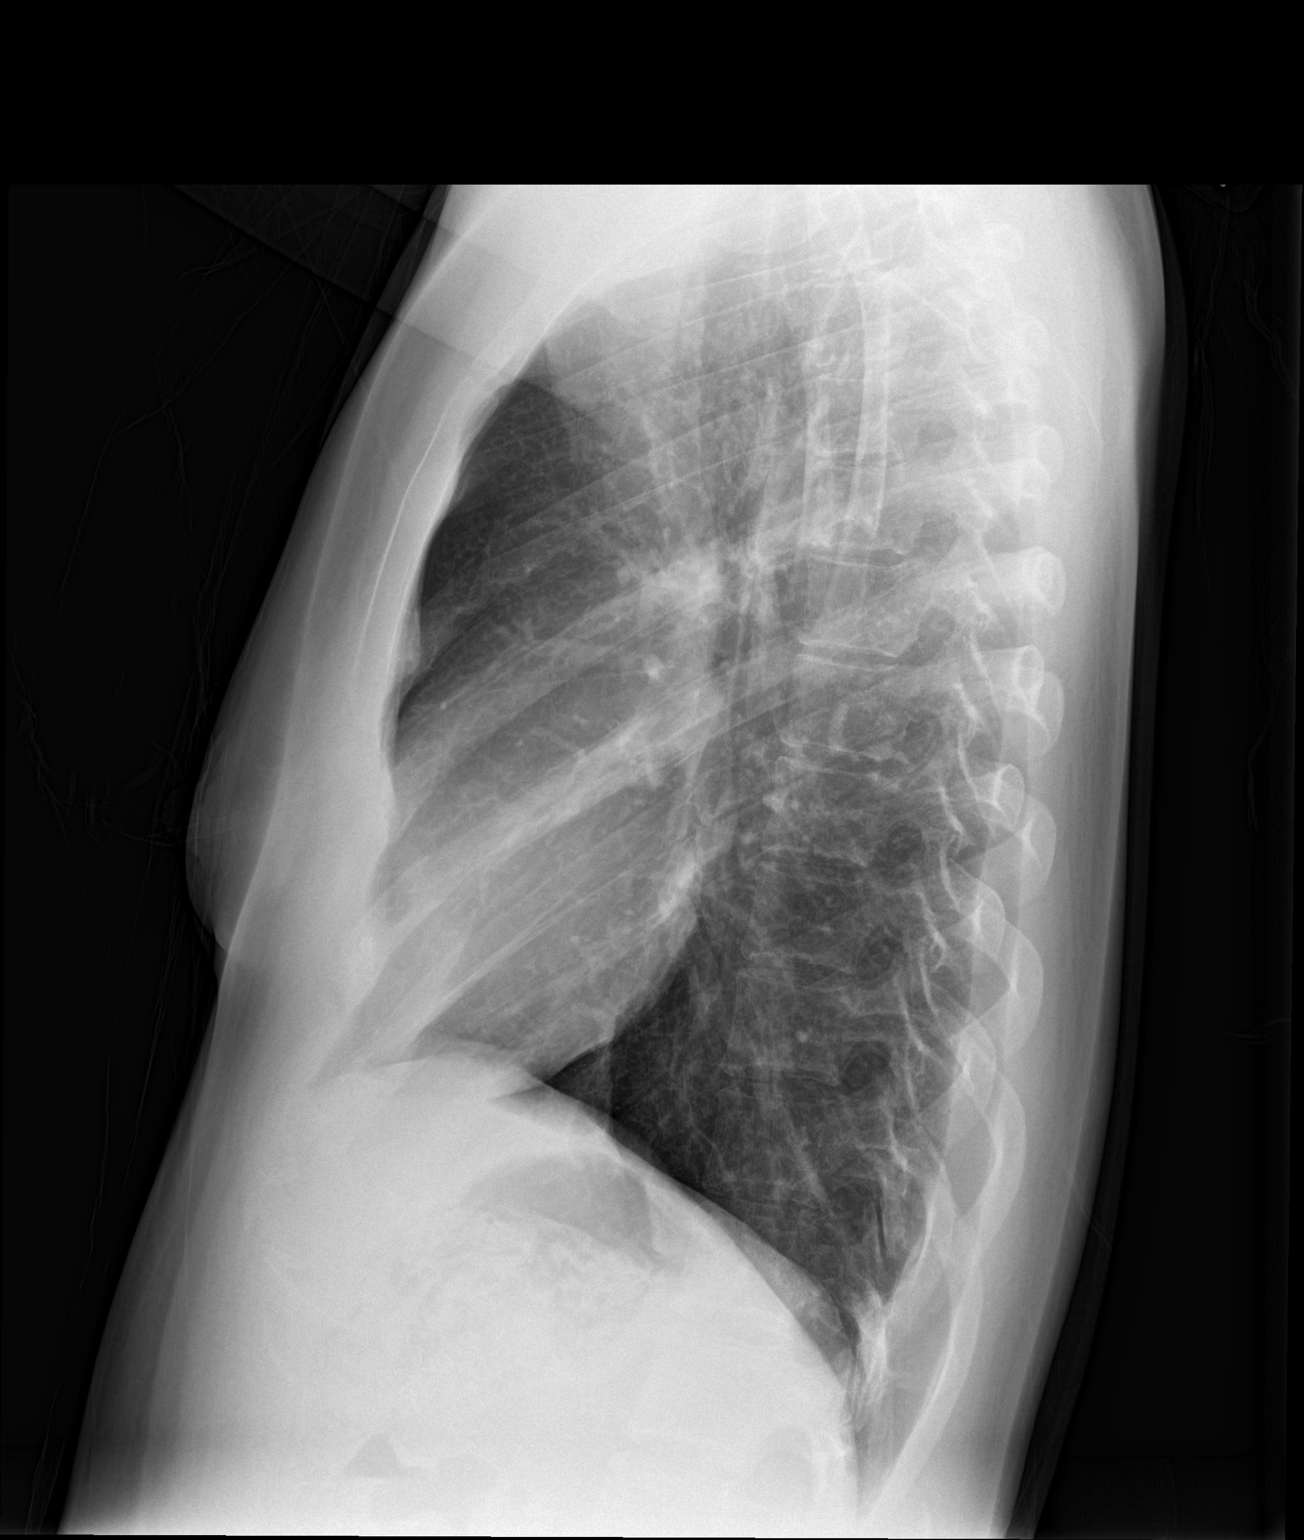

[2 of 2 positions shown; findings below may reference images not displayed]

FINDINGS: Mild streaky right middle lobe and lingular opacities, not well
visualized on the lateral view, atelectasis versus pneumonia. No
pleural effusion or pneumothorax.

The heart is normal in size.

Visualized osseous structures are within normal limits.
IMPRESSION: Mild streaky right middle lobe and lingular opacities, atelectasis
versus pneumonia.

## 2017-04-04 IMAGING — CR DG CHEST 2V
2 series · 2 of 2 positions shown · non-contrast
Comparison: 02/12/2015 and earlier.

CLINICAL DATA: 29-year-old female with left chest pain for 2 weeks.
Recently treated pneumonia but symptoms persist. Subsequent
encounter. Smoker.

EXAM:
CHEST  2 VIEW

[chest pa]
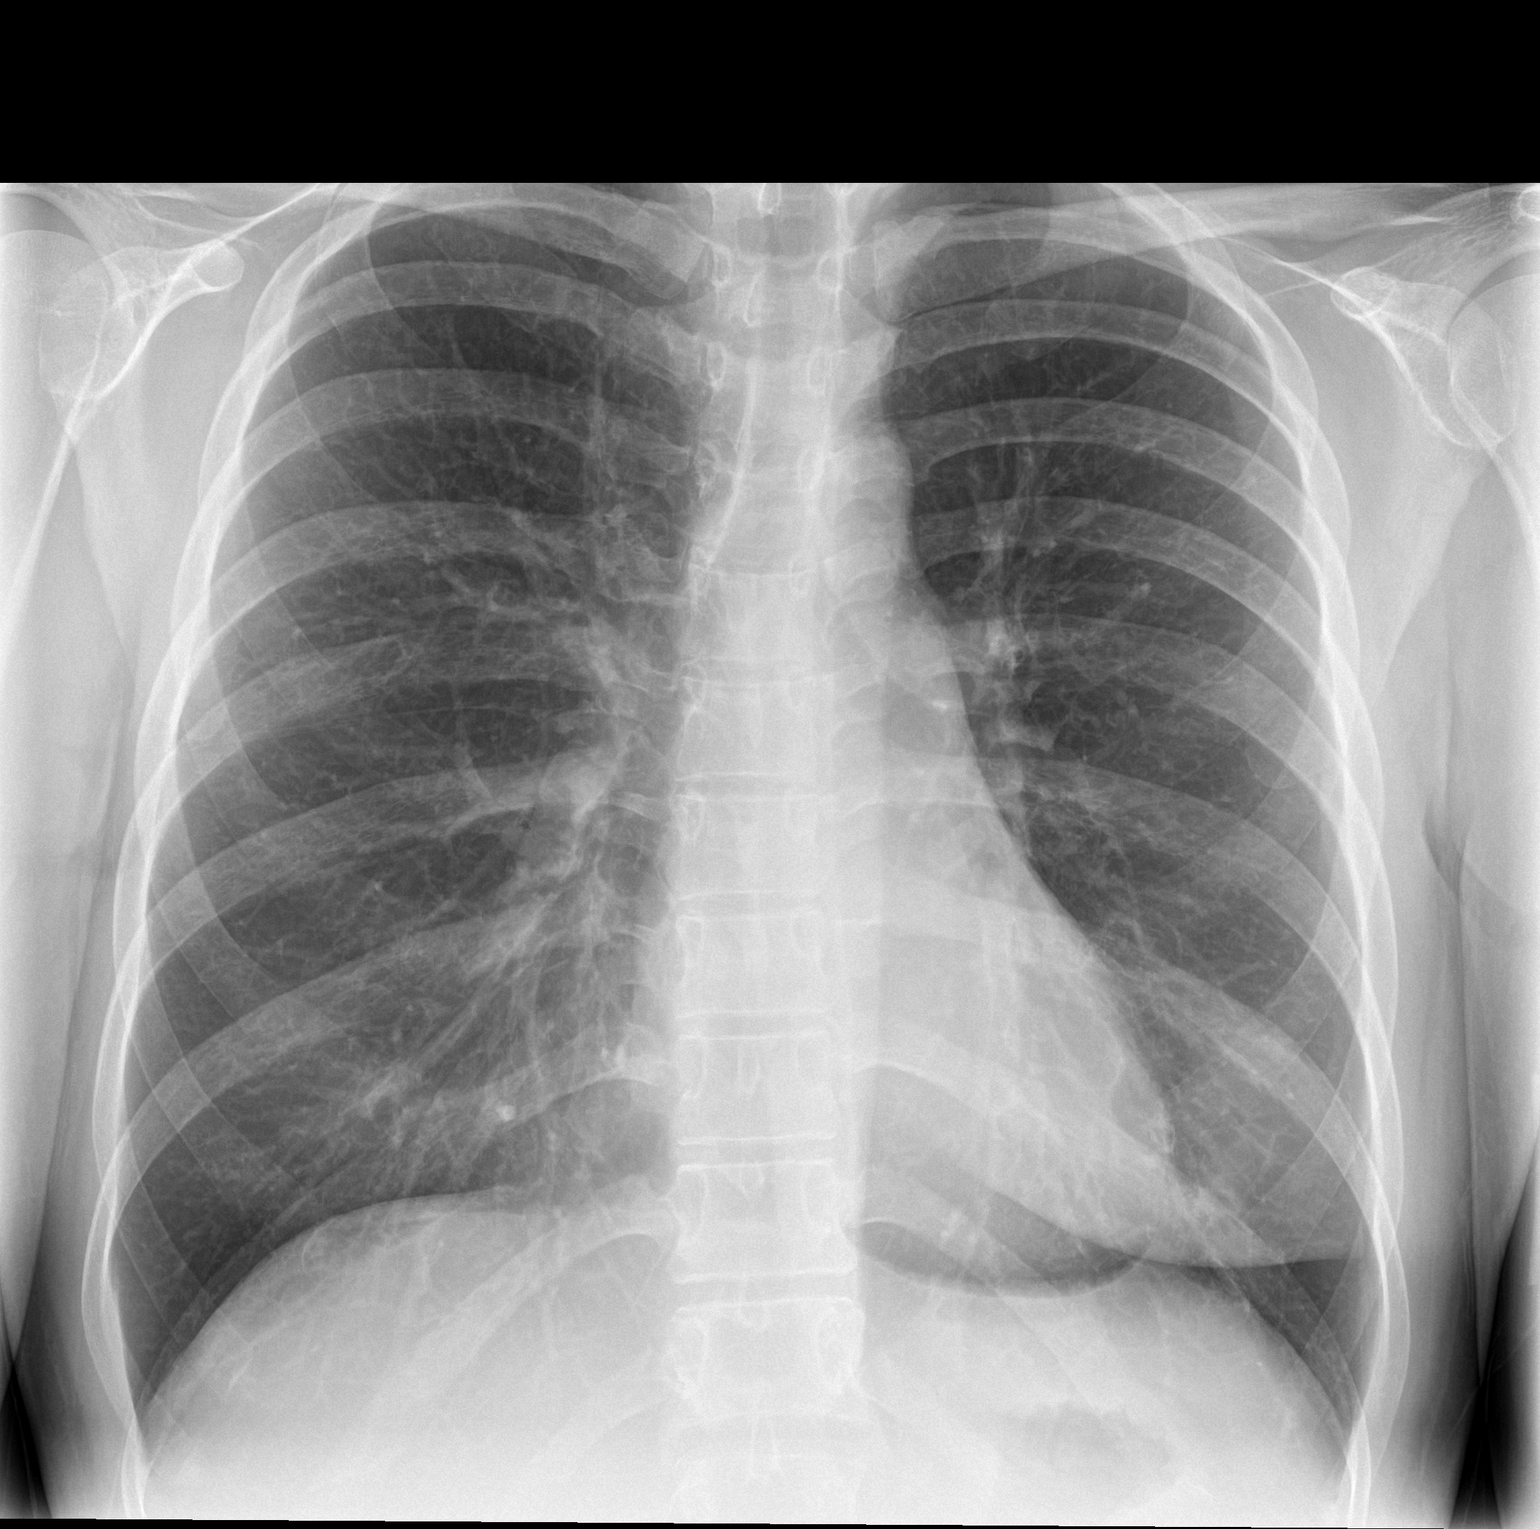

[chest lat]
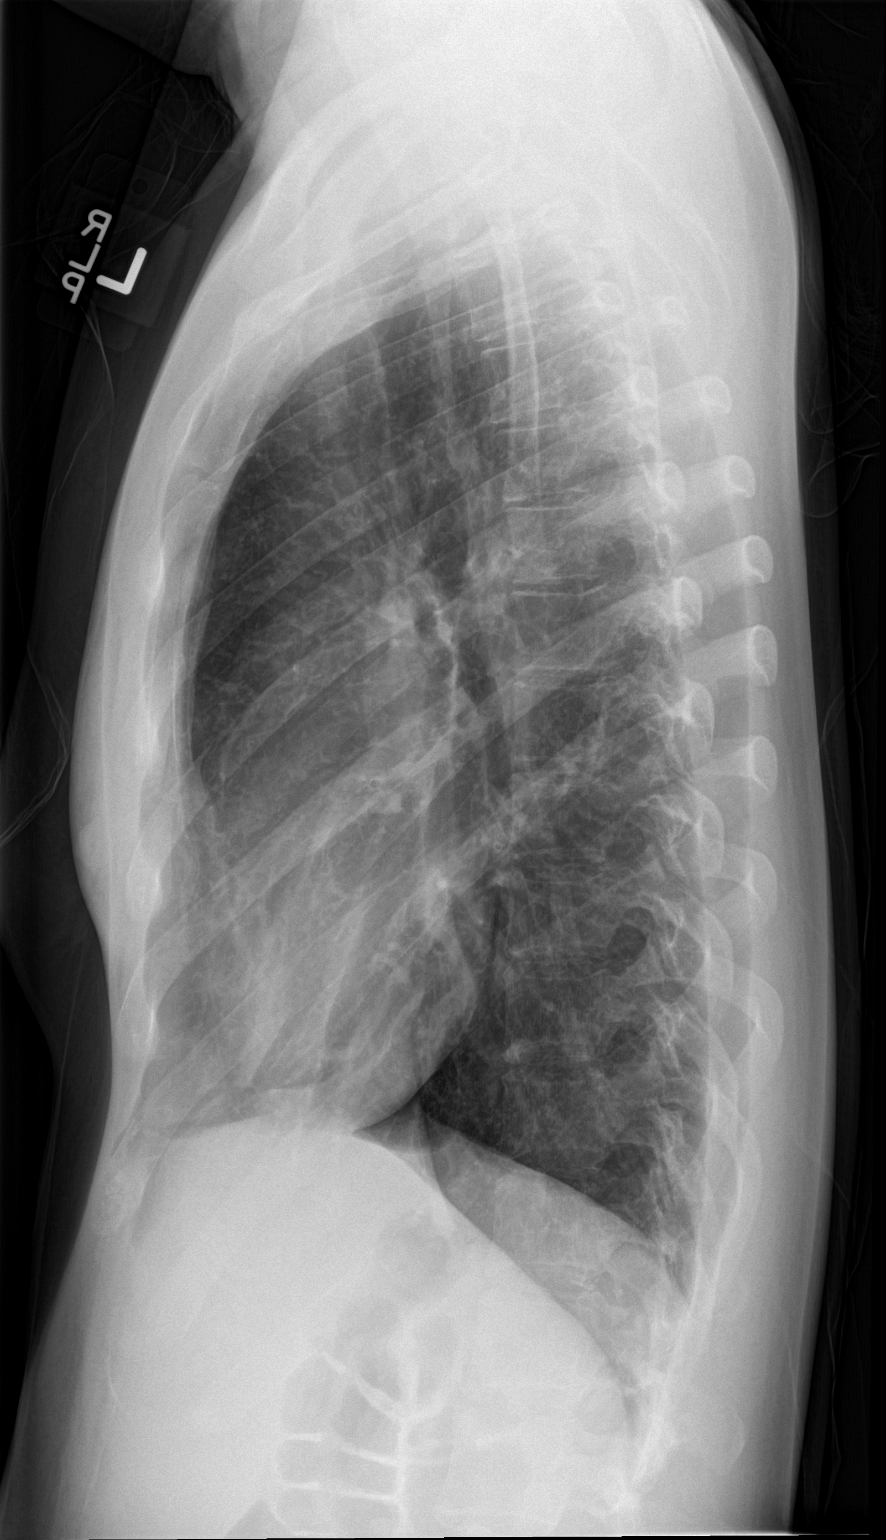

[2 of 2 positions shown; findings below may reference images not displayed]

FINDINGS: Chronic basilar predominant increased pulmonary interstitial
markings. Decreased bilateral middle lobe patchy and nodular opacity
since [REDACTED]. No pleural effusion. No areas of worsening
ventilation. Chronic tenting of the inferior left pulmonary
ligament. Some tram tracking is suggested in the middle lobes such
as due to bronchiectasis. Mediastinal contours remain normal.
Visualized tracheal air column is within normal limits. No
pneumothorax. No acute osseous abnormality identified.
IMPRESSION: Regressed bilateral middle lobe patchy and nodular opacity since
[REDACTED].

Lung markings now appear at baseline, with a degree of bilateral
middle lobe Bronchiectasis and other chronic pulmonary interstitial
changes suspected and probably related to smoking.

## 2018-07-04 ENCOUNTER — Ambulatory Visit
Admission: EM | Admit: 2018-07-04 | Discharge: 2018-07-04 | Disposition: A | Discharge status: Home / Self Care | Payer: BLUE CROSS/BLUE SHIELD | Attending: Emergency Medicine | Admitting: Emergency Medicine

## 2018-07-04 DIAGNOSIS — H1011 Acute atopic conjunctivitis, right eye: Secondary | ICD-10-CM

## 2018-07-04 MED ORDER — HYDROCORTISONE 2.5 % EX LOTN: TOPICAL_LOTION | Freq: Two times a day (BID) | CUTANEOUS | 0 refills | Status: AC

## 2018-07-04 MED ORDER — KETOROLAC TROMETHAMINE 0.5 % OP SOLN: 1.0000 [drp] | Freq: Four times a day (QID) | OPHTHALMIC | 0 refills | Status: AC

## 2018-07-04 MED ORDER — OLOPATADINE HCL 0.1 % OP SOLN: 1.0000 [drp] | Freq: Two times a day (BID) | OPHTHALMIC | 12 refills | Status: AC

## 2018-07-04 NOTE — Discharge Instructions (Signed)
Cool compresses as necessary for 10 minutes at a time for comfort.  Apply the hydrocortisone lotion around the eye 2 times daily.  If not improved by Monday or Tuesday follow-up with [facility]

## 2018-07-04 NOTE — ED Triage Notes (Signed)
Pt having left eye pain, swelling and redness since Wednesday and thought she maybe got glitter in her left eye from making valentines day baskets.

## 2018-07-04 NOTE — ED Provider Notes (Signed)
[facility]    CSN: [id number] Arrival date & time: 07/04/18  1247     History   Chief Complaint Chief Complaint  Patient presents with  . Conjunctivitis    Appt    HPI Sarah Thompson is a 33 y.o. female.   HPI  33 year old female presents with left eye pain swelling and redness that started on Wednesday.  Bought a bag with glitter on it at [employer] store for her daughters school; inadvertently thinks she may have gotten glitter in her eye.  Has been sensitive since that time and has swollen as well.  Has been applying over-the-counter medications not helping may have actuall worsened her somewhat.  This morning the eye was matted shut when she awoke.  Discharge from her eyes been copious and very clear.  There is been no yellow-green etc.  It is somewhat tender but the remainder of the periorbital area is not tender nor erythematous and red.  Acuity is normal.         Past Medical History:  Diagnosis Date  . Asthma     There are no active problems to display for this patient.   Past Surgical History:  Procedure Laterality Date  . adnoids removed      OB History   No obstetric history on file.      Home Medications    Prior to Admission medications   Medication Sig Start Date End Date Taking? Authorizing Provider  sertraline (ZOLOFT) 100 MG tablet Take 100 mg by mouth daily.   Yes [provider]  acetaminophen (TYLENOL) 500 MG tablet Take 1,000 mg by mouth every 6 (six) hours as needed for mild pain or moderate pain.    [provider]  albuterol (PROVENTIL HFA;VENTOLIN HFA) 108 (90 BASE) MCG/ACT inhaler Inhale 2 puffs into the lungs every 6 (six) hours as needed for wheezing or shortness of breath. 02/12/15   Triplett, Rulon Eisenmengerari B, FNP  amoxicillin (AMOXIL) 500 MG tablet Take 1 tablet (500 mg total) by mouth 3 (three) times daily. 08/14/15   Beers, Charmayne Sheerharles M, PA-C  buprenorphine-naloxone (SUBOXONE) 8-2 mg SUBL SL tablet Place 1  tablet under the tongue 3 (three) times daily.    [provider]  chlorpheniramine-HYDROcodone (TUSSIONEX PENNKINETIC ER) 10-8 MG/5ML SUER Take 5 mLs by mouth every 12 (twelve) hours as needed for cough. 02/22/15   Triplett, Rulon Eisenmengerari B, FNP  diphenhydrAMINE (BENADRYL) 25 mg capsule Take 1 capsule (25 mg total) by mouth every 4 (four) hours as needed. 07/11/16 07/18/16  Enid DerryWagner, Ashley, PA-C  hydrocortisone 2.5 % lotion Apply topically 2 (two) times daily. 07/04/18   Lutricia Feiloemer,  P, PA-C  ketorolac (ACULAR) 0.5 % ophthalmic solution Place 1 drop into both eyes every 6 (six) hours. 07/04/18   Lutricia Feiloemer,  P, PA-C  lidocaine (XYLOCAINE) 2 % solution Use as directed 20 mLs in the mouth or throat as needed for mouth pain. 08/14/15   Beers, Charmayne Sheerharles M, PA-C  olopatadine (PATANOL) 0.1 % ophthalmic solution Place 1 drop into the left eye 2 (two) times daily. 07/04/18   Lutricia Feiloemer,  P, PA-C  potassium chloride (K-DUR) 10 MEQ tablet Take 1 tablet (10 mEq total) by mouth daily. 01/17/17   Triplett, Rulon Eisenmengerari B, FNP  ranitidine (ZANTAC) 150 MG tablet Take 1 tablet (150 mg total) by mouth 2 (two) times daily. 07/11/16 07/18/16  Enid DerryWagner, Ashley, PA-C  traMADol (ULTRAM) 50 MG tablet Take 1 tablet (50 mg total) by mouth every 6 (six) hours as  needed. 03/12/15   Jennye Moccasin, MD    Family History Family History  Adopted: Yes    Social History Social History   Tobacco Use  . Smoking status: Current Every Day Smoker  . Smokeless tobacco: Never Used  Substance Use Topics  . Alcohol use: No  . Drug use: No     Allergies   Sulfa antibiotics   Review of Systems Review of Systems  Constitutional: Positive for activity change. Negative for appetite change, chills, fatigue and fever.  Eyes: Positive for pain, discharge and redness. Negative for photophobia, itching and visual disturbance.  All other systems reviewed and are negative.    Physical Exam Triage Vital Signs ED Triage Vitals  Enc Vitals  Group     BP 07/04/18 1304 126/82     Pulse Rate 07/04/18 1304 87     Resp 07/04/18 1304 18     Temp 07/04/18 1304 98.4 F (36.9 C)     Temp Source 07/04/18 1304 Oral     SpO2 07/04/18 1304 96 %     Weight 07/04/18 1305 140 lb (63.5 kg)     Height 07/04/18 1305 4\' 11"  (1.499 m)     Head Circumference --      Peak Flow --      Pain Score 07/04/18 1305 3     Pain Loc --      Pain Edu? --      Excl. in GC? --    No data found.  Updated Vital Signs BP 126/82 (BP Location: Left Arm)   Pulse 87   Temp 98.4 F (36.9 C) (Oral)   Resp 18   Ht 4\' 11"  (1.499 m)   Wt 140 lb (63.5 kg)   LMP 06/20/2018 (Exact Date)   SpO2 96%   BMI 28.28 kg/m   Visual Acuity Right Eye Distance: 20/13 Left Eye Distance: 20/30 Bilateral Distance:    Right Eye Near:   Left Eye Near:    Bilateral Near:     Physical Exam Vitals signs and nursing note reviewed.  Constitutional:      General: She is not in acute distress.    Appearance: Normal appearance. She is not ill-appearing, toxic-appearing or diaphoretic.  HENT:     Head: Normocephalic and atraumatic.     Nose: Nose normal. No congestion or rhinorrhea.     Mouth/Throat:     Mouth: Mucous membranes are moist.     Pharynx: Oropharynx is clear. No oropharyngeal exudate or posterior oropharyngeal erythema.  Eyes:     General:        Left eye: Discharge present.    Extraocular Movements: Extraocular movements intact.     Pupils: Pupils are equal, round, and reactive to light.     Comments: Examination of the left eye shows the periorbital area to be swollen.  She has mild tenderness over the upper lid but the remainder of the swollen area is nontender and that is not erythematous.  He has a copious amount of clear fluid discharge from her eye.  PERRLA, EOMs are intact.  Upper lid was everted without finding any foreign body.  The eyelashes and lids were clean.  The eye was anesthetized with tetracaine after full explanation to the patient.   Fluorescein stain was then utilized and under [employer] [employer] there is no obvious foreign body seen and no abrasions of the cornea were seen.  Neck:     Musculoskeletal: Normal range of motion and neck supple.  Pulmonary:     Effort: Pulmonary effort is normal.     Breath sounds: Normal breath sounds.  Musculoskeletal: Normal range of motion.  Lymphadenopathy:     Cervical: No cervical adenopathy.  Skin:    General: Skin is warm and dry.  Neurological:     General: No focal deficit present.     Mental Status: She is alert and oriented to person, place, and time.  Psychiatric:        Mood and Affect: Mood normal.        Behavior: Behavior normal.        Thought Content: Thought content normal.        Judgment: Judgment normal.      UC Treatments / Results  Labs (all labs ordered are listed, but only abnormal results are displayed) Labs Reviewed - No data to display  EKG None  Radiology No results found.  Procedures Procedures (including critical care time)  Medications Ordered in UC Medications - No data to display  Initial Impression / Assessment and Plan / UC Course  I have reviewed the triage vital signs and the nursing notes.  Pertinent labs & imaging results that were available during my care of the patient were reviewed by me and considered in my medical decision making (see chart for details).   Patient has an allergic reaction causing a allergic conjunctivitis.  Will prescribe Pataday eyedrops along with ketorolac for pain.  I have asked her to apply 2-1/2% hydrocortisone lotion around the eye lid.  Use cool compresses as necessary for comfort.  To follow-up with [facility] if she is not improved on Monday.   Final Clinical Impressions(s) / UC Diagnoses   Final diagnoses:  Allergic conjunctivitis of right eye     Discharge Instructions     Cool compresses as necessary for 10 minutes at a time for comfort.  Apply the hydrocortisone lotion  around the eye 2 times daily.  If not improved by Monday or Tuesday follow-up with  eye    ED Prescriptions    Medication Sig Dispense Auth. Provider   olopatadine (PATANOL) 0.1 % ophthalmic solution Place 1 drop into the left eye 2 (two) times daily. 5 mL Ovid Curd P, PA-C   hydrocortisone 2.5 % lotion Apply topically 2 (two) times daily. 59 mL Ovid Curd P, PA-C   ketorolac (ACULAR) 0.5 % ophthalmic solution Place 1 drop into both eyes every 6 (six) hours. 5 mL Lutricia Feil, PA-C     Controlled Substance Prescriptions [place] Controlled Substance Registry consulted? Not Applicable   Lutricia Feil, PA-C 07/04/18 3291

## 2021-10-11 ENCOUNTER — Ambulatory Visit
Admission: RE | Admit: 2021-10-11 | Discharge: 2021-10-11 | Disposition: A | Discharge status: Home / Self Care | Payer: BC Managed Care – PPO | Source: Ambulatory Visit

## 2021-10-11 VITALS — BP 131/83 | HR 89 | Temp 98.5°F | Resp 18

## 2021-10-11 DIAGNOSIS — J45901 Unspecified asthma with (acute) exacerbation: Secondary | ICD-10-CM

## 2021-10-11 DIAGNOSIS — J40 Bronchitis, not specified as acute or chronic: Secondary | ICD-10-CM

## 2021-10-11 DIAGNOSIS — M940 Chondrocostal junction syndrome [Tietze]: Secondary | ICD-10-CM | POA: Diagnosis not present

## 2021-10-11 MED ORDER — BENZONATATE 200 MG PO CAPS: 200.0000 mg | ORAL_CAPSULE | Freq: Three times a day (TID) | ORAL | 0 refills | Status: AC | PRN

## 2021-10-11 MED ORDER — DOXYCYCLINE HYCLATE 100 MG PO CAPS: 100.0000 mg | ORAL_CAPSULE | Freq: Two times a day (BID) | ORAL | 0 refills | Status: AC

## 2021-10-11 MED ORDER — IPRATROPIUM-ALBUTEROL 0.5-2.5 (3) MG/3ML IN SOLN
3.0000 mL | Freq: Once | RESPIRATORY_TRACT | Status: AC
  Administered 2021-10-11: 3 mL via RESPIRATORY_TRACT

## 2021-10-11 MED ORDER — PREDNISONE 20 MG PO TABS: 20.0000 mg | ORAL_TABLET | Freq: Every day | ORAL | 0 refills | Status: AC

## 2021-10-11 NOTE — ED Provider Notes (Signed)
[facility]    CSN: [id number] Arrival date & time: 10/11/21  1849      History   Chief Complaint Chief Complaint  Patient presents with   Cough    Have had congestion and pain in right lung area for a couple weeks. Hurts to tske a deep breath. No fever - Entered by patient    HPI Sarah Thompson is a 36 y.o. female who has been coughing x 2 weeks and this week her cough and wheezing are getting worse. She is a smoker but has been cutting down in the past year, from 1 ppd, to 3 cigarettes a day.    Past Medical History:  Diagnosis Date   Asthma     There are no problems to display for this patient.   Past Surgical History:  Procedure Laterality Date   adnoids removed      OB History   No obstetric history on file.      Home Medications    Prior to Admission medications   Medication Sig Start Date End Date Taking? Authorizing Provider  benzonatate (TESSALON) 200 MG capsule Take 1 capsule (200 mg total) by mouth 3 (three) times daily as needed. 10/11/21  Yes Rodriguez-Southworth, Nettie Elm, PA-C  buprenorphine-naloxone (SUBOXONE) 8-2 mg SUBL SL tablet Place 1 tablet under the tongue 3 (three) times daily.   Yes [provider]  doxycycline (VIBRAMYCIN) 100 MG capsule Take 1 capsule (100 mg total) by mouth 2 (two) times daily. 10/11/21  Yes Rodriguez-Southworth, Nettie Elm, PA-C  predniSONE (DELTASONE) 20 MG tablet Take 1 tablet (20 mg total) by mouth daily with breakfast. 10/11/21  Yes Rodriguez-Southworth, Nettie Elm, PA-C  sertraline (ZOLOFT) 25 MG tablet Take by mouth. 09/24/21  Yes [provider]  VICTOZA 18 MG/3ML SOPN SMARTSIG:0.6 Milligram(s) SUB-Q Daily 09/24/21  Yes [provider]  acetaminophen (TYLENOL) 500 MG tablet Take 1,000 mg by mouth every 6 (six) hours as needed for mild pain or moderate pain.    [provider]  albuterol (PROVENTIL HFA;VENTOLIN HFA) 108 (90 BASE) MCG/ACT inhaler Inhale 2 puffs into the lungs every 6  (six) hours as needed for wheezing or shortness of breath. 02/12/15   Triplett, Rulon Eisenmenger B, FNP  chlorpheniramine-HYDROcodone (TUSSIONEX PENNKINETIC ER) 10-8 MG/5ML SUER Take 5 mLs by mouth every 12 (twelve) hours as needed for cough. 02/22/15   Triplett, Rulon Eisenmenger B, FNP  diphenhydrAMINE (BENADRYL) 25 mg capsule Take 1 capsule (25 mg total) by mouth every 4 (four) hours as needed. 07/11/16 07/18/16  Enid Derry, PA-C  hydrocortisone 2.5 % lotion Apply topically 2 (two) times daily. 07/04/18   Lutricia Feil, PA-C  ketorolac (ACULAR) 0.5 % ophthalmic solution Place 1 drop into both eyes every 6 (six) hours. 07/04/18   Lutricia Feil, PA-C  lidocaine (XYLOCAINE) 2 % solution Use as directed 20 mLs in the mouth or throat as needed for mouth pain. 08/14/15   Beers, Charmayne Sheer, PA-C  olopatadine (PATANOL) 0.1 % ophthalmic solution Place 1 drop into the left eye 2 (two) times daily. 07/04/18   Lutricia Feil, PA-C  potassium chloride (K-DUR) 10 MEQ tablet Take 1 tablet (10 mEq total) by mouth daily. 01/17/17   Triplett, Rulon Eisenmenger B, FNP  ranitidine (ZANTAC) 150 MG tablet Take 1 tablet (150 mg total) by mouth 2 (two) times daily. 07/11/16 07/18/16  Enid Derry, PA-C  sertraline (ZOLOFT) 100 MG tablet Take 100 mg by mouth daily.    [provider]  traMADol (ULTRAM) 50 MG  tablet Take 1 tablet (50 mg total) by mouth every 6 (six) hours as needed. 03/12/15   Jennye Moccasin, MD    Family History Family History  Adopted: Yes    Social History Social History   Tobacco Use   Smoking status: Every Day   Smokeless tobacco: Never  Vaping Use   Vaping Use: Some days  Substance Use Topics   Alcohol use: No   Drug use: No     Allergies   Sulfa antibiotics   Review of Systems Review of Systems  Constitutional:  Positive for fatigue. Negative for chills, diaphoresis and fever.  HENT:  Positive for postnasal drip, rhinorrhea and sneezing. Negative for congestion.   Eyes:  Negative for discharge.   Respiratory:  Positive for cough, shortness of breath and wheezing. Negative for chest tightness.   Cardiovascular:  Positive for chest pain.       R lower lateral rib pain  Skin:  Negative for color change, rash and wound.    Physical Exam Triage Vital Signs ED Triage Vitals  Enc Vitals Group     BP 10/11/21 1858 131/83     Pulse Rate 10/11/21 1858 89     Resp 10/11/21 1858 18     Temp 10/11/21 1858 98.5 F (36.9 C)     Temp Source 10/11/21 1858 Oral     SpO2 10/11/21 1858 98 %     Weight --      Height --      Head Circumference --      Peak Flow --      Pain Score 10/11/21 1856 6     Pain Loc --      Pain Edu? --      Excl. in GC? --    No data found.  Updated Vital Signs BP 131/83 (BP Location: Right Arm)   Pulse 89   Temp 98.5 F (36.9 C) (Oral)   Resp 18   LMP 10/04/2021 (Approximate)   SpO2 98%   Visual Acuity Right Eye Distance:   Left Eye Distance:   Bilateral Distance:    Right Eye Near:   Left Eye Near:    Bilateral Near:      Physical Exam Constitutional:      General: He is not in acute distress.    Appearance: He is not toxic-appearing.  HENT:     Head: Normocephalic.     Right Ear: Tympanic membrane, ear canal and external ear normal.     Left Ear: Ear canal and external ear normal.     Nose: Nose normal.     Mouth/Throat:     Mouth: Mucous membranes are moist.     Pharynx: Oropharynx is clear.  Eyes:     General: No scleral icterus.    Conjunctiva/sclera: Conjunctivae normal.  Cardiovascular:     Rate and Rhythm: Normal rate and regular rhythm.     Heart sounds: No murmur heard.   Pulmonary:     Effort: Pulmonary effort is normal. No respiratory distress.     Breath sounds: Wheezing present.     Comments: Has auditory wheezing Her R mid anterior and lateral rib is sore to palpation. No crepitations noted Musculoskeletal:        General: Normal range of motion.     Cervical back: Neck supple.  Lymphadenopathy:     Cervical:  No cervical adenopathy.  Skin:    General: Skin is warm and dry.     Findings: No  rash.  Neurological:     Mental Status: He is alert and oriented to person, place, and time.     Gait: Gait normal.  Psychiatric:        Mood and Affect: Mood normal.        Behavior: Behavior normal.        Thought Content: Thought content normal.        Judgment: Judgment normal.    UC Treatments / Results  Labs (all labs ordered are listed, but only abnormal results are displayed) Labs Reviewed - No data to display  EKG   Radiology No results found.  Procedures Procedures (including critical care time)  Medications Ordered in UC Medications  ipratropium-albuterol (DUONEB) 0.5-2.5 (3) MG/3ML nebulizer solution 3 mL (has no administration in time range)    Initial Impression / Assessment and Plan / UC Course  I have reviewed the triage vital signs and the nursing notes. She was given A duoneb treatment here and wheezing was improved after this.  Acute bronchitis I placed her on Doxy, prednisone, and Tessalon as noted.    Final Clinical Impressions(s) / UC Diagnoses   Final diagnoses:  Exacerbation of asthma, unspecified asthma severity, unspecified whether persistent  Bronchitis  Costochondritis     Discharge Instructions      Continue working on quit smoking.     ED Prescriptions     Medication Sig Dispense Auth. Provider   doxycycline (VIBRAMYCIN) 100 MG capsule Take 1 capsule (100 mg total) by mouth 2 (two) times daily. 20 capsule Rodriguez-Southworth, Nettie ElmSylvia, PA-C   predniSONE (DELTASONE) 20 MG tablet Take 1 tablet (20 mg total) by mouth daily with breakfast. 5 tablet Rodriguez-Southworth, Nettie ElmSylvia, PA-C   benzonatate (TESSALON) 200 MG capsule Take 1 capsule (200 mg total) by mouth 3 (three) times daily as needed. 30 capsule Rodriguez-Southworth, Nettie ElmSylvia, [place]-C      I have reviewed the PDMP during this encounter.   Garey HamRodriguez-Southworth, Porter Moes, [place]-C 10/12/21 [phone]

## 2021-10-11 NOTE — Discharge Instructions (Signed)
Continue working on quit smoking.

## 2021-10-11 NOTE — ED Triage Notes (Signed)
Pt presents with cough and chest congestion x 2 weeks. The past 4 days she has been having right side rib pain.
